# Patient Record
Sex: Female | Born: 1935 | Race: White | Hispanic: No | Smoking: Never smoker
Health system: Southern US, Community
[De-identification: ages and names within clinical notes are randomized; demographics above are authoritative.]

## PROBLEM LIST (undated history)

## (undated) DIAGNOSIS — E119 Type 2 diabetes mellitus without complications: Secondary | ICD-10-CM

## (undated) DIAGNOSIS — I10 Essential (primary) hypertension: Secondary | ICD-10-CM

## (undated) DIAGNOSIS — R7303 Prediabetes: Secondary | ICD-10-CM

## (undated) DIAGNOSIS — N189 Chronic kidney disease, unspecified: Secondary | ICD-10-CM

---

## 2000-01-14 ENCOUNTER — Ambulatory Visit (HOSPITAL_COMMUNITY): Admission: RE | Admit: 2000-01-14 | Discharge: 2000-01-14 | Payer: Self-pay | Admitting: Ophthalmology

## 2000-04-14 ENCOUNTER — Ambulatory Visit (HOSPITAL_COMMUNITY): Admission: RE | Admit: 2000-04-14 | Discharge: 2000-04-14 | Payer: Self-pay | Admitting: Ophthalmology

## 2002-02-14 ENCOUNTER — Other Ambulatory Visit: Admission: RE | Admit: 2002-02-14 | Discharge: 2002-02-14 | Payer: Self-pay | Admitting: Obstetrics and Gynecology

## 2011-05-08 DIAGNOSIS — H04209 Unspecified epiphora, unspecified lacrimal gland: Secondary | ICD-10-CM | POA: Diagnosis not present

## 2011-08-20 DIAGNOSIS — I129 Hypertensive chronic kidney disease with stage 1 through stage 4 chronic kidney disease, or unspecified chronic kidney disease: Secondary | ICD-10-CM | POA: Diagnosis not present

## 2011-08-20 DIAGNOSIS — E785 Hyperlipidemia, unspecified: Secondary | ICD-10-CM | POA: Diagnosis not present

## 2011-08-20 DIAGNOSIS — E119 Type 2 diabetes mellitus without complications: Secondary | ICD-10-CM | POA: Diagnosis not present

## 2011-08-20 DIAGNOSIS — I10 Essential (primary) hypertension: Secondary | ICD-10-CM | POA: Diagnosis not present

## 2011-11-24 DIAGNOSIS — Z23 Encounter for immunization: Secondary | ICD-10-CM | POA: Diagnosis not present

## 2011-11-24 DIAGNOSIS — I10 Essential (primary) hypertension: Secondary | ICD-10-CM | POA: Diagnosis not present

## 2011-11-24 DIAGNOSIS — E7800 Pure hypercholesterolemia, unspecified: Secondary | ICD-10-CM | POA: Diagnosis not present

## 2011-11-24 DIAGNOSIS — N189 Chronic kidney disease, unspecified: Secondary | ICD-10-CM | POA: Diagnosis not present

## 2011-11-24 DIAGNOSIS — E119 Type 2 diabetes mellitus without complications: Secondary | ICD-10-CM | POA: Diagnosis not present

## 2011-11-24 DIAGNOSIS — Z79890 Hormone replacement therapy (postmenopausal): Secondary | ICD-10-CM | POA: Diagnosis not present

## 2012-01-16 DIAGNOSIS — T783XXA Angioneurotic edema, initial encounter: Secondary | ICD-10-CM | POA: Diagnosis not present

## 2012-01-21 DIAGNOSIS — Z961 Presence of intraocular lens: Secondary | ICD-10-CM | POA: Diagnosis not present

## 2012-02-03 DIAGNOSIS — Z1231 Encounter for screening mammogram for malignant neoplasm of breast: Secondary | ICD-10-CM | POA: Diagnosis not present

## 2012-05-03 DIAGNOSIS — E119 Type 2 diabetes mellitus without complications: Secondary | ICD-10-CM | POA: Diagnosis not present

## 2012-05-03 DIAGNOSIS — E213 Hyperparathyroidism, unspecified: Secondary | ICD-10-CM | POA: Diagnosis not present

## 2012-05-03 DIAGNOSIS — I129 Hypertensive chronic kidney disease with stage 1 through stage 4 chronic kidney disease, or unspecified chronic kidney disease: Secondary | ICD-10-CM | POA: Diagnosis not present

## 2012-05-13 DIAGNOSIS — D235 Other benign neoplasm of skin of trunk: Secondary | ICD-10-CM | POA: Diagnosis not present

## 2012-05-13 DIAGNOSIS — L821 Other seborrheic keratosis: Secondary | ICD-10-CM | POA: Diagnosis not present

## 2012-05-24 DIAGNOSIS — Z79890 Hormone replacement therapy (postmenopausal): Secondary | ICD-10-CM | POA: Diagnosis not present

## 2012-05-24 DIAGNOSIS — E119 Type 2 diabetes mellitus without complications: Secondary | ICD-10-CM | POA: Diagnosis not present

## 2012-05-24 DIAGNOSIS — I10 Essential (primary) hypertension: Secondary | ICD-10-CM | POA: Diagnosis not present

## 2012-05-24 DIAGNOSIS — Z23 Encounter for immunization: Secondary | ICD-10-CM | POA: Diagnosis not present

## 2012-05-24 DIAGNOSIS — E7800 Pure hypercholesterolemia, unspecified: Secondary | ICD-10-CM | POA: Diagnosis not present

## 2012-05-24 DIAGNOSIS — M899 Disorder of bone, unspecified: Secondary | ICD-10-CM | POA: Diagnosis not present

## 2012-05-24 DIAGNOSIS — N189 Chronic kidney disease, unspecified: Secondary | ICD-10-CM | POA: Diagnosis not present

## 2012-05-24 DIAGNOSIS — M949 Disorder of cartilage, unspecified: Secondary | ICD-10-CM | POA: Diagnosis not present

## 2012-10-05 DIAGNOSIS — J209 Acute bronchitis, unspecified: Secondary | ICD-10-CM | POA: Diagnosis not present

## 2012-11-23 DIAGNOSIS — I10 Essential (primary) hypertension: Secondary | ICD-10-CM | POA: Diagnosis not present

## 2012-11-23 DIAGNOSIS — E119 Type 2 diabetes mellitus without complications: Secondary | ICD-10-CM | POA: Diagnosis not present

## 2012-11-23 DIAGNOSIS — I129 Hypertensive chronic kidney disease with stage 1 through stage 4 chronic kidney disease, or unspecified chronic kidney disease: Secondary | ICD-10-CM | POA: Diagnosis not present

## 2012-11-26 DIAGNOSIS — R0989 Other specified symptoms and signs involving the circulatory and respiratory systems: Secondary | ICD-10-CM | POA: Diagnosis not present

## 2012-11-26 DIAGNOSIS — I10 Essential (primary) hypertension: Secondary | ICD-10-CM | POA: Diagnosis not present

## 2012-11-26 DIAGNOSIS — E119 Type 2 diabetes mellitus without complications: Secondary | ICD-10-CM | POA: Diagnosis not present

## 2012-11-26 DIAGNOSIS — N189 Chronic kidney disease, unspecified: Secondary | ICD-10-CM | POA: Diagnosis not present

## 2012-11-26 DIAGNOSIS — E7800 Pure hypercholesterolemia, unspecified: Secondary | ICD-10-CM | POA: Diagnosis not present

## 2012-12-15 DIAGNOSIS — D649 Anemia, unspecified: Secondary | ICD-10-CM | POA: Diagnosis not present

## 2012-12-20 ENCOUNTER — Ambulatory Visit (HOSPITAL_COMMUNITY): Payer: Medicare Other | Attending: Cardiology

## 2012-12-20 DIAGNOSIS — E119 Type 2 diabetes mellitus without complications: Secondary | ICD-10-CM | POA: Insufficient documentation

## 2012-12-20 DIAGNOSIS — E1159 Type 2 diabetes mellitus with other circulatory complications: Secondary | ICD-10-CM | POA: Diagnosis not present

## 2012-12-20 DIAGNOSIS — I10 Essential (primary) hypertension: Secondary | ICD-10-CM | POA: Diagnosis not present

## 2012-12-20 DIAGNOSIS — R0989 Other specified symptoms and signs involving the circulatory and respiratory systems: Secondary | ICD-10-CM | POA: Insufficient documentation

## 2012-12-20 DIAGNOSIS — Z87891 Personal history of nicotine dependence: Secondary | ICD-10-CM | POA: Diagnosis not present

## 2012-12-22 DIAGNOSIS — Z23 Encounter for immunization: Secondary | ICD-10-CM | POA: Diagnosis not present

## 2013-01-03 DIAGNOSIS — H04129 Dry eye syndrome of unspecified lacrimal gland: Secondary | ICD-10-CM | POA: Diagnosis not present

## 2013-01-03 DIAGNOSIS — Z961 Presence of intraocular lens: Secondary | ICD-10-CM | POA: Diagnosis not present

## 2013-01-03 DIAGNOSIS — H524 Presbyopia: Secondary | ICD-10-CM | POA: Diagnosis not present

## 2013-02-28 DIAGNOSIS — Z1231 Encounter for screening mammogram for malignant neoplasm of breast: Secondary | ICD-10-CM | POA: Diagnosis not present

## 2013-05-27 DIAGNOSIS — Z79890 Hormone replacement therapy (postmenopausal): Secondary | ICD-10-CM | POA: Diagnosis not present

## 2013-05-27 DIAGNOSIS — N189 Chronic kidney disease, unspecified: Secondary | ICD-10-CM | POA: Diagnosis not present

## 2013-05-27 DIAGNOSIS — I10 Essential (primary) hypertension: Secondary | ICD-10-CM | POA: Diagnosis not present

## 2013-05-27 DIAGNOSIS — D649 Anemia, unspecified: Secondary | ICD-10-CM | POA: Diagnosis not present

## 2013-05-27 DIAGNOSIS — E7800 Pure hypercholesterolemia, unspecified: Secondary | ICD-10-CM | POA: Diagnosis not present

## 2013-05-27 DIAGNOSIS — E119 Type 2 diabetes mellitus without complications: Secondary | ICD-10-CM | POA: Diagnosis not present

## 2013-05-27 DIAGNOSIS — Z1331 Encounter for screening for depression: Secondary | ICD-10-CM | POA: Diagnosis not present

## 2013-05-27 DIAGNOSIS — M899 Disorder of bone, unspecified: Secondary | ICD-10-CM | POA: Diagnosis not present

## 2013-05-27 DIAGNOSIS — M949 Disorder of cartilage, unspecified: Secondary | ICD-10-CM | POA: Diagnosis not present

## 2013-07-05 DIAGNOSIS — N183 Chronic kidney disease, stage 3 unspecified: Secondary | ICD-10-CM | POA: Diagnosis not present

## 2013-07-05 DIAGNOSIS — I129 Hypertensive chronic kidney disease with stage 1 through stage 4 chronic kidney disease, or unspecified chronic kidney disease: Secondary | ICD-10-CM | POA: Diagnosis not present

## 2013-07-05 DIAGNOSIS — N2581 Secondary hyperparathyroidism of renal origin: Secondary | ICD-10-CM | POA: Diagnosis not present

## 2013-07-05 DIAGNOSIS — E785 Hyperlipidemia, unspecified: Secondary | ICD-10-CM | POA: Diagnosis not present

## 2013-07-05 DIAGNOSIS — E1129 Type 2 diabetes mellitus with other diabetic kidney complication: Secondary | ICD-10-CM | POA: Diagnosis not present

## 2013-08-31 DIAGNOSIS — Z111 Encounter for screening for respiratory tuberculosis: Secondary | ICD-10-CM | POA: Diagnosis not present

## 2013-08-31 DIAGNOSIS — Z0000 Encounter for general adult medical examination without abnormal findings: Secondary | ICD-10-CM | POA: Diagnosis not present

## 2013-08-31 DIAGNOSIS — Z1331 Encounter for screening for depression: Secondary | ICD-10-CM | POA: Diagnosis not present

## 2013-09-02 ENCOUNTER — Ambulatory Visit
Admission: RE | Admit: 2013-09-02 | Discharge: 2013-09-02 | Disposition: A | Discharge status: Home / Self Care | Payer: Medicare Other | Source: Ambulatory Visit | Attending: Family Medicine | Admitting: Family Medicine

## 2013-09-02 ENCOUNTER — Other Ambulatory Visit: Payer: Self-pay | Admitting: Family Medicine

## 2013-09-02 DIAGNOSIS — R9389 Abnormal findings on diagnostic imaging of other specified body structures: Secondary | ICD-10-CM

## 2013-09-02 DIAGNOSIS — J984 Other disorders of lung: Secondary | ICD-10-CM | POA: Diagnosis not present

## 2013-09-08 ENCOUNTER — Other Ambulatory Visit: Payer: Self-pay | Admitting: Family Medicine

## 2013-09-08 DIAGNOSIS — R9389 Abnormal findings on diagnostic imaging of other specified body structures: Secondary | ICD-10-CM

## 2013-09-09 ENCOUNTER — Ambulatory Visit
Admission: RE | Admit: 2013-09-09 | Discharge: 2013-09-09 | Disposition: A | Discharge status: Home / Self Care | Payer: Medicare Other | Source: Ambulatory Visit | Attending: Family Medicine | Admitting: Family Medicine

## 2013-09-09 DIAGNOSIS — E079 Disorder of thyroid, unspecified: Secondary | ICD-10-CM | POA: Diagnosis not present

## 2013-09-09 DIAGNOSIS — R9389 Abnormal findings on diagnostic imaging of other specified body structures: Secondary | ICD-10-CM

## 2013-11-28 DIAGNOSIS — Z23 Encounter for immunization: Secondary | ICD-10-CM | POA: Diagnosis not present

## 2013-11-28 DIAGNOSIS — M899 Disorder of bone, unspecified: Secondary | ICD-10-CM | POA: Diagnosis not present

## 2013-11-28 DIAGNOSIS — E119 Type 2 diabetes mellitus without complications: Secondary | ICD-10-CM | POA: Diagnosis not present

## 2013-11-28 DIAGNOSIS — E7800 Pure hypercholesterolemia, unspecified: Secondary | ICD-10-CM | POA: Diagnosis not present

## 2013-11-28 DIAGNOSIS — M949 Disorder of cartilage, unspecified: Secondary | ICD-10-CM | POA: Diagnosis not present

## 2013-11-28 DIAGNOSIS — I10 Essential (primary) hypertension: Secondary | ICD-10-CM | POA: Diagnosis not present

## 2013-11-28 DIAGNOSIS — N189 Chronic kidney disease, unspecified: Secondary | ICD-10-CM | POA: Diagnosis not present

## 2013-12-28 DIAGNOSIS — H5212 Myopia, left eye: Secondary | ICD-10-CM | POA: Diagnosis not present

## 2013-12-28 DIAGNOSIS — H52202 Unspecified astigmatism, left eye: Secondary | ICD-10-CM | POA: Diagnosis not present

## 2013-12-28 DIAGNOSIS — H52201 Unspecified astigmatism, right eye: Secondary | ICD-10-CM | POA: Diagnosis not present

## 2013-12-28 DIAGNOSIS — Z961 Presence of intraocular lens: Secondary | ICD-10-CM | POA: Diagnosis not present

## 2014-01-02 DIAGNOSIS — M899 Disorder of bone, unspecified: Secondary | ICD-10-CM | POA: Diagnosis not present

## 2014-03-28 DIAGNOSIS — E782 Mixed hyperlipidemia: Secondary | ICD-10-CM | POA: Diagnosis not present

## 2014-03-28 DIAGNOSIS — N2581 Secondary hyperparathyroidism of renal origin: Secondary | ICD-10-CM | POA: Diagnosis not present

## 2014-03-28 DIAGNOSIS — I129 Hypertensive chronic kidney disease with stage 1 through stage 4 chronic kidney disease, or unspecified chronic kidney disease: Secondary | ICD-10-CM | POA: Diagnosis not present

## 2014-03-28 DIAGNOSIS — E1122 Type 2 diabetes mellitus with diabetic chronic kidney disease: Secondary | ICD-10-CM | POA: Diagnosis not present

## 2014-03-28 DIAGNOSIS — N183 Chronic kidney disease, stage 3 (moderate): Secondary | ICD-10-CM | POA: Diagnosis not present

## 2014-04-12 DIAGNOSIS — Z1231 Encounter for screening mammogram for malignant neoplasm of breast: Secondary | ICD-10-CM | POA: Diagnosis not present

## 2014-05-29 DIAGNOSIS — E119 Type 2 diabetes mellitus without complications: Secondary | ICD-10-CM | POA: Diagnosis not present

## 2014-05-29 DIAGNOSIS — Z79890 Hormone replacement therapy (postmenopausal): Secondary | ICD-10-CM | POA: Diagnosis not present

## 2014-05-29 DIAGNOSIS — I10 Essential (primary) hypertension: Secondary | ICD-10-CM | POA: Diagnosis not present

## 2014-05-29 DIAGNOSIS — M8580 Other specified disorders of bone density and structure, unspecified site: Secondary | ICD-10-CM | POA: Diagnosis not present

## 2014-05-29 DIAGNOSIS — E780 Pure hypercholesterolemia: Secondary | ICD-10-CM | POA: Diagnosis not present

## 2014-05-29 DIAGNOSIS — N189 Chronic kidney disease, unspecified: Secondary | ICD-10-CM | POA: Diagnosis not present

## 2014-11-07 DIAGNOSIS — N183 Chronic kidney disease, stage 3 (moderate): Secondary | ICD-10-CM | POA: Diagnosis not present

## 2014-11-07 DIAGNOSIS — D631 Anemia in chronic kidney disease: Secondary | ICD-10-CM | POA: Diagnosis not present

## 2014-11-07 DIAGNOSIS — E1122 Type 2 diabetes mellitus with diabetic chronic kidney disease: Secondary | ICD-10-CM | POA: Diagnosis not present

## 2014-11-07 DIAGNOSIS — I129 Hypertensive chronic kidney disease with stage 1 through stage 4 chronic kidney disease, or unspecified chronic kidney disease: Secondary | ICD-10-CM | POA: Diagnosis not present

## 2014-11-07 DIAGNOSIS — N2581 Secondary hyperparathyroidism of renal origin: Secondary | ICD-10-CM | POA: Diagnosis not present

## 2014-11-29 DIAGNOSIS — Z1389 Encounter for screening for other disorder: Secondary | ICD-10-CM | POA: Diagnosis not present

## 2014-11-29 DIAGNOSIS — Z79890 Hormone replacement therapy (postmenopausal): Secondary | ICD-10-CM | POA: Diagnosis not present

## 2014-11-29 DIAGNOSIS — E119 Type 2 diabetes mellitus without complications: Secondary | ICD-10-CM | POA: Diagnosis not present

## 2014-11-29 DIAGNOSIS — N189 Chronic kidney disease, unspecified: Secondary | ICD-10-CM | POA: Diagnosis not present

## 2014-11-29 DIAGNOSIS — E780 Pure hypercholesterolemia: Secondary | ICD-10-CM | POA: Diagnosis not present

## 2014-11-29 DIAGNOSIS — I10 Essential (primary) hypertension: Secondary | ICD-10-CM | POA: Diagnosis not present

## 2014-11-29 DIAGNOSIS — Z23 Encounter for immunization: Secondary | ICD-10-CM | POA: Diagnosis not present

## 2015-03-22 DIAGNOSIS — M79601 Pain in right arm: Secondary | ICD-10-CM | POA: Diagnosis not present

## 2015-03-29 DIAGNOSIS — S4991XA Unspecified injury of right shoulder and upper arm, initial encounter: Secondary | ICD-10-CM | POA: Diagnosis not present

## 2015-03-29 DIAGNOSIS — M25511 Pain in right shoulder: Secondary | ICD-10-CM | POA: Diagnosis not present

## 2015-04-06 DIAGNOSIS — M25511 Pain in right shoulder: Secondary | ICD-10-CM | POA: Diagnosis not present

## 2015-04-06 DIAGNOSIS — G8929 Other chronic pain: Secondary | ICD-10-CM | POA: Diagnosis not present

## 2015-04-11 DIAGNOSIS — H40003 Preglaucoma, unspecified, bilateral: Secondary | ICD-10-CM | POA: Diagnosis not present

## 2015-04-11 DIAGNOSIS — Z961 Presence of intraocular lens: Secondary | ICD-10-CM | POA: Diagnosis not present

## 2015-04-11 DIAGNOSIS — H35371 Puckering of macula, right eye: Secondary | ICD-10-CM | POA: Diagnosis not present

## 2015-04-18 DIAGNOSIS — Z803 Family history of malignant neoplasm of breast: Secondary | ICD-10-CM | POA: Diagnosis not present

## 2015-04-18 DIAGNOSIS — Z1231 Encounter for screening mammogram for malignant neoplasm of breast: Secondary | ICD-10-CM | POA: Diagnosis not present

## 2015-05-10 DIAGNOSIS — S4991XD Unspecified injury of right shoulder and upper arm, subsequent encounter: Secondary | ICD-10-CM | POA: Diagnosis not present

## 2015-05-10 DIAGNOSIS — M25511 Pain in right shoulder: Secondary | ICD-10-CM | POA: Diagnosis not present

## 2015-06-15 DIAGNOSIS — R05 Cough: Secondary | ICD-10-CM | POA: Diagnosis not present

## 2015-08-09 DIAGNOSIS — I129 Hypertensive chronic kidney disease with stage 1 through stage 4 chronic kidney disease, or unspecified chronic kidney disease: Secondary | ICD-10-CM | POA: Diagnosis not present

## 2015-08-09 DIAGNOSIS — E782 Mixed hyperlipidemia: Secondary | ICD-10-CM | POA: Diagnosis not present

## 2015-08-09 DIAGNOSIS — D631 Anemia in chronic kidney disease: Secondary | ICD-10-CM | POA: Diagnosis not present

## 2015-08-09 DIAGNOSIS — N183 Chronic kidney disease, stage 3 (moderate): Secondary | ICD-10-CM | POA: Diagnosis not present

## 2015-08-09 DIAGNOSIS — N2581 Secondary hyperparathyroidism of renal origin: Secondary | ICD-10-CM | POA: Diagnosis not present

## 2015-08-09 DIAGNOSIS — Z0000 Encounter for general adult medical examination without abnormal findings: Secondary | ICD-10-CM | POA: Diagnosis not present

## 2015-08-09 DIAGNOSIS — E1122 Type 2 diabetes mellitus with diabetic chronic kidney disease: Secondary | ICD-10-CM | POA: Diagnosis not present

## 2015-11-20 DIAGNOSIS — N189 Chronic kidney disease, unspecified: Secondary | ICD-10-CM | POA: Diagnosis not present

## 2015-11-20 DIAGNOSIS — I10 Essential (primary) hypertension: Secondary | ICD-10-CM | POA: Diagnosis not present

## 2015-11-20 DIAGNOSIS — B373 Candidiasis of vulva and vagina: Secondary | ICD-10-CM | POA: Diagnosis not present

## 2015-11-20 DIAGNOSIS — E119 Type 2 diabetes mellitus without complications: Secondary | ICD-10-CM | POA: Diagnosis not present

## 2015-11-20 DIAGNOSIS — E7800 Pure hypercholesterolemia, unspecified: Secondary | ICD-10-CM | POA: Diagnosis not present

## 2015-11-22 DIAGNOSIS — I10 Essential (primary) hypertension: Secondary | ICD-10-CM | POA: Diagnosis not present

## 2015-11-22 DIAGNOSIS — N189 Chronic kidney disease, unspecified: Secondary | ICD-10-CM | POA: Diagnosis not present

## 2015-11-22 DIAGNOSIS — E7800 Pure hypercholesterolemia, unspecified: Secondary | ICD-10-CM | POA: Diagnosis not present

## 2015-11-22 DIAGNOSIS — E119 Type 2 diabetes mellitus without complications: Secondary | ICD-10-CM | POA: Diagnosis not present

## 2015-11-22 DIAGNOSIS — B373 Candidiasis of vulva and vagina: Secondary | ICD-10-CM | POA: Diagnosis not present

## 2015-12-18 DIAGNOSIS — Z23 Encounter for immunization: Secondary | ICD-10-CM | POA: Diagnosis not present

## 2016-02-25 DIAGNOSIS — I129 Hypertensive chronic kidney disease with stage 1 through stage 4 chronic kidney disease, or unspecified chronic kidney disease: Secondary | ICD-10-CM | POA: Diagnosis not present

## 2016-02-25 DIAGNOSIS — E1122 Type 2 diabetes mellitus with diabetic chronic kidney disease: Secondary | ICD-10-CM | POA: Diagnosis not present

## 2016-02-25 DIAGNOSIS — N183 Chronic kidney disease, stage 3 (moderate): Secondary | ICD-10-CM | POA: Diagnosis not present

## 2016-02-25 DIAGNOSIS — D631 Anemia in chronic kidney disease: Secondary | ICD-10-CM | POA: Diagnosis not present

## 2016-02-25 DIAGNOSIS — N2581 Secondary hyperparathyroidism of renal origin: Secondary | ICD-10-CM | POA: Diagnosis not present

## 2016-02-25 DIAGNOSIS — E782 Mixed hyperlipidemia: Secondary | ICD-10-CM | POA: Diagnosis not present

## 2016-04-24 DIAGNOSIS — Z1231 Encounter for screening mammogram for malignant neoplasm of breast: Secondary | ICD-10-CM | POA: Diagnosis not present

## 2016-04-24 DIAGNOSIS — M85851 Other specified disorders of bone density and structure, right thigh: Secondary | ICD-10-CM | POA: Diagnosis not present

## 2016-04-28 DIAGNOSIS — H40013 Open angle with borderline findings, low risk, bilateral: Secondary | ICD-10-CM | POA: Diagnosis not present

## 2016-04-28 DIAGNOSIS — H35371 Puckering of macula, right eye: Secondary | ICD-10-CM | POA: Diagnosis not present

## 2016-04-28 DIAGNOSIS — Z961 Presence of intraocular lens: Secondary | ICD-10-CM | POA: Diagnosis not present

## 2016-09-01 DIAGNOSIS — E7800 Pure hypercholesterolemia, unspecified: Secondary | ICD-10-CM | POA: Diagnosis not present

## 2016-09-01 DIAGNOSIS — N189 Chronic kidney disease, unspecified: Secondary | ICD-10-CM | POA: Diagnosis not present

## 2016-09-01 DIAGNOSIS — E119 Type 2 diabetes mellitus without complications: Secondary | ICD-10-CM | POA: Diagnosis not present

## 2016-09-01 DIAGNOSIS — M25561 Pain in right knee: Secondary | ICD-10-CM | POA: Diagnosis not present

## 2016-09-01 DIAGNOSIS — I10 Essential (primary) hypertension: Secondary | ICD-10-CM | POA: Diagnosis not present

## 2016-09-08 DIAGNOSIS — D631 Anemia in chronic kidney disease: Secondary | ICD-10-CM | POA: Diagnosis not present

## 2016-09-08 DIAGNOSIS — E782 Mixed hyperlipidemia: Secondary | ICD-10-CM | POA: Diagnosis not present

## 2016-09-08 DIAGNOSIS — I129 Hypertensive chronic kidney disease with stage 1 through stage 4 chronic kidney disease, or unspecified chronic kidney disease: Secondary | ICD-10-CM | POA: Diagnosis not present

## 2016-09-08 DIAGNOSIS — N2581 Secondary hyperparathyroidism of renal origin: Secondary | ICD-10-CM | POA: Diagnosis not present

## 2016-09-08 DIAGNOSIS — N183 Chronic kidney disease, stage 3 (moderate): Secondary | ICD-10-CM | POA: Diagnosis not present

## 2016-09-08 DIAGNOSIS — E1122 Type 2 diabetes mellitus with diabetic chronic kidney disease: Secondary | ICD-10-CM | POA: Diagnosis not present

## 2016-09-12 DIAGNOSIS — G8929 Other chronic pain: Secondary | ICD-10-CM | POA: Diagnosis not present

## 2016-09-12 DIAGNOSIS — M1711 Unilateral primary osteoarthritis, right knee: Secondary | ICD-10-CM | POA: Diagnosis not present

## 2016-09-12 DIAGNOSIS — M25561 Pain in right knee: Secondary | ICD-10-CM | POA: Diagnosis not present

## 2016-09-23 DIAGNOSIS — M1711 Unilateral primary osteoarthritis, right knee: Secondary | ICD-10-CM | POA: Diagnosis not present

## 2016-11-16 DIAGNOSIS — Z23 Encounter for immunization: Secondary | ICD-10-CM | POA: Diagnosis not present

## 2016-11-21 DIAGNOSIS — K6289 Other specified diseases of anus and rectum: Secondary | ICD-10-CM | POA: Diagnosis not present

## 2017-03-24 DIAGNOSIS — N189 Chronic kidney disease, unspecified: Secondary | ICD-10-CM | POA: Diagnosis not present

## 2017-03-24 DIAGNOSIS — E7800 Pure hypercholesterolemia, unspecified: Secondary | ICD-10-CM | POA: Diagnosis not present

## 2017-03-24 DIAGNOSIS — M8580 Other specified disorders of bone density and structure, unspecified site: Secondary | ICD-10-CM | POA: Diagnosis not present

## 2017-03-24 DIAGNOSIS — E119 Type 2 diabetes mellitus without complications: Secondary | ICD-10-CM | POA: Diagnosis not present

## 2017-03-24 DIAGNOSIS — I10 Essential (primary) hypertension: Secondary | ICD-10-CM | POA: Diagnosis not present

## 2017-04-27 DIAGNOSIS — D631 Anemia in chronic kidney disease: Secondary | ICD-10-CM | POA: Diagnosis not present

## 2017-04-27 DIAGNOSIS — I129 Hypertensive chronic kidney disease with stage 1 through stage 4 chronic kidney disease, or unspecified chronic kidney disease: Secondary | ICD-10-CM | POA: Diagnosis not present

## 2017-04-27 DIAGNOSIS — E1122 Type 2 diabetes mellitus with diabetic chronic kidney disease: Secondary | ICD-10-CM | POA: Diagnosis not present

## 2017-04-27 DIAGNOSIS — N189 Chronic kidney disease, unspecified: Secondary | ICD-10-CM | POA: Diagnosis not present

## 2017-04-27 DIAGNOSIS — N183 Chronic kidney disease, stage 3 (moderate): Secondary | ICD-10-CM | POA: Diagnosis not present

## 2017-04-27 DIAGNOSIS — N2581 Secondary hyperparathyroidism of renal origin: Secondary | ICD-10-CM | POA: Diagnosis not present

## 2017-04-27 DIAGNOSIS — E782 Mixed hyperlipidemia: Secondary | ICD-10-CM | POA: Diagnosis not present

## 2017-04-29 DIAGNOSIS — H35371 Puckering of macula, right eye: Secondary | ICD-10-CM | POA: Diagnosis not present

## 2017-04-29 DIAGNOSIS — H40013 Open angle with borderline findings, low risk, bilateral: Secondary | ICD-10-CM | POA: Diagnosis not present

## 2017-04-29 DIAGNOSIS — Z961 Presence of intraocular lens: Secondary | ICD-10-CM | POA: Diagnosis not present

## 2017-04-29 DIAGNOSIS — E119 Type 2 diabetes mellitus without complications: Secondary | ICD-10-CM | POA: Diagnosis not present

## 2017-04-30 DIAGNOSIS — Z1231 Encounter for screening mammogram for malignant neoplasm of breast: Secondary | ICD-10-CM | POA: Diagnosis not present

## 2017-06-03 DIAGNOSIS — R000 Tachycardia, unspecified: Secondary | ICD-10-CM | POA: Diagnosis not present

## 2017-06-08 ENCOUNTER — Encounter (HOSPITAL_COMMUNITY): Payer: Self-pay | Admitting: Emergency Medicine

## 2017-06-08 ENCOUNTER — Emergency Department (HOSPITAL_COMMUNITY): Payer: Medicare Other

## 2017-06-08 ENCOUNTER — Inpatient Hospital Stay (HOSPITAL_COMMUNITY)
Admission: EM | Admit: 2017-06-08 | Discharge: 2017-06-10 | DRG: 308 | Disposition: A | Discharge status: Home / Self Care | Payer: Medicare Other | Attending: Cardiovascular Disease | Admitting: Cardiovascular Disease

## 2017-06-08 ENCOUNTER — Other Ambulatory Visit: Payer: Self-pay

## 2017-06-08 DIAGNOSIS — J180 Bronchopneumonia, unspecified organism: Secondary | ICD-10-CM | POA: Diagnosis present

## 2017-06-08 DIAGNOSIS — R000 Tachycardia, unspecified: Secondary | ICD-10-CM | POA: Diagnosis not present

## 2017-06-08 DIAGNOSIS — Z9889 Other specified postprocedural states: Secondary | ICD-10-CM

## 2017-06-08 DIAGNOSIS — I484 Atypical atrial flutter: Principal | ICD-10-CM | POA: Diagnosis present

## 2017-06-08 DIAGNOSIS — R9431 Abnormal electrocardiogram [ECG] [EKG]: Secondary | ICD-10-CM | POA: Diagnosis not present

## 2017-06-08 DIAGNOSIS — I471 Supraventricular tachycardia: Secondary | ICD-10-CM | POA: Diagnosis not present

## 2017-06-08 DIAGNOSIS — N184 Chronic kidney disease, stage 4 (severe): Secondary | ICD-10-CM

## 2017-06-08 DIAGNOSIS — I10 Essential (primary) hypertension: Secondary | ICD-10-CM

## 2017-06-08 DIAGNOSIS — E1122 Type 2 diabetes mellitus with diabetic chronic kidney disease: Secondary | ICD-10-CM | POA: Diagnosis not present

## 2017-06-08 DIAGNOSIS — Z880 Allergy status to penicillin: Secondary | ICD-10-CM

## 2017-06-08 DIAGNOSIS — I129 Hypertensive chronic kidney disease with stage 1 through stage 4 chronic kidney disease, or unspecified chronic kidney disease: Secondary | ICD-10-CM | POA: Diagnosis present

## 2017-06-08 DIAGNOSIS — I4892 Unspecified atrial flutter: Secondary | ICD-10-CM | POA: Diagnosis not present

## 2017-06-08 DIAGNOSIS — Z887 Allergy status to serum and vaccine status: Secondary | ICD-10-CM

## 2017-06-08 DIAGNOSIS — R911 Solitary pulmonary nodule: Secondary | ICD-10-CM | POA: Diagnosis present

## 2017-06-08 DIAGNOSIS — R7303 Prediabetes: Secondary | ICD-10-CM

## 2017-06-08 DIAGNOSIS — Z833 Family history of diabetes mellitus: Secondary | ICD-10-CM

## 2017-06-08 DIAGNOSIS — Z7982 Long term (current) use of aspirin: Secondary | ICD-10-CM

## 2017-06-08 DIAGNOSIS — Z79899 Other long term (current) drug therapy: Secondary | ICD-10-CM

## 2017-06-08 DIAGNOSIS — E785 Hyperlipidemia, unspecified: Secondary | ICD-10-CM | POA: Diagnosis present

## 2017-06-08 HISTORY — DX: Essential (primary) hypertension: I10

## 2017-06-08 HISTORY — DX: Chronic kidney disease, unspecified: N18.9

## 2017-06-08 HISTORY — DX: Type 2 diabetes mellitus without complications: E11.9

## 2017-06-08 HISTORY — DX: Prediabetes: R73.03

## 2017-06-08 LAB — BASIC METABOLIC PANEL
ANION GAP: 12 (ref 5–15)
BUN: 34 mg/dL — AB (ref 6–20)
CALCIUM: 9.8 mg/dL (ref 8.9–10.3)
CO2: 19 mmol/L — AB (ref 22–32)
CREATININE: 1.49 mg/dL — AB (ref 0.44–1.00)
Chloride: 106 mmol/L (ref 101–111)
GFR calc Af Amer: 37 mL/min — ABNORMAL LOW (ref >60)
GFR, EST NON AFRICAN AMERICAN: 32 mL/min — AB (ref >60)
GLUCOSE: 100 mg/dL — AB (ref 65–99)
Potassium: 5.3 mmol/L — ABNORMAL HIGH (ref 3.5–5.1)
Sodium: 137 mmol/L (ref 135–145)

## 2017-06-08 LAB — HEPATIC FUNCTION PANEL
ALBUMIN: 3.4 g/dL — AB (ref 3.5–5.0)
ALT: 16 U/L (ref 14–54)
AST: 28 U/L (ref 15–41)
Alkaline Phosphatase: 67 U/L (ref 38–126)
Total Bilirubin: 0.4 mg/dL (ref 0.3–1.2)
Total Protein: 6.8 g/dL (ref 6.5–8.1)

## 2017-06-08 LAB — HEMOGLOBIN A1C
HEMOGLOBIN A1C: 5.7 % — AB (ref 4.8–5.6)
Mean Plasma Glucose: 116.89 mg/dL

## 2017-06-08 LAB — CBC
HCT: 39.6 % (ref 36.0–46.0)
HEMOGLOBIN: 12.8 g/dL (ref 12.0–15.0)
MCH: 30.3 pg (ref 26.0–34.0)
MCHC: 32.3 g/dL (ref 30.0–36.0)
MCV: 93.6 fL (ref 78.0–100.0)
PLATELETS: 219 10*3/uL (ref 150–400)
RBC: 4.23 MIL/uL (ref 3.87–5.11)
RDW: 13.5 % (ref 11.5–15.5)
WBC: 6.6 10*3/uL (ref 4.0–10.5)

## 2017-06-08 LAB — I-STAT TROPONIN, ED: TROPONIN I, POC: 0 ng/mL (ref 0.00–0.08)

## 2017-06-08 LAB — PROTIME-INR
INR: 1.01
PROTHROMBIN TIME: 13.2 s (ref 11.4–15.2)

## 2017-06-08 LAB — TSH: TSH: 3.144 u[IU]/mL (ref 0.350–4.500)

## 2017-06-08 LAB — TROPONIN I

## 2017-06-08 LAB — MAGNESIUM: MAGNESIUM: 2 mg/dL (ref 1.7–2.4)

## 2017-06-08 MED ORDER — SODIUM CHLORIDE 0.9 % IV BOLUS
500.0000 mL | Freq: Once | INTRAVENOUS | Status: AC
  Administered 2017-06-08: 500 mL via INTRAVENOUS

## 2017-06-08 MED ORDER — ONDANSETRON HCL 4 MG/2ML IJ SOLN: 4.0000 mg | Freq: Four times a day (QID) | INTRAMUSCULAR | Status: DC | PRN

## 2017-06-08 MED ORDER — ADULT MULTIVITAMIN W/MINERALS CH
1.0000 | ORAL_TABLET | Freq: Every day | ORAL | Status: DC
  Administered 2017-06-09 – 2017-06-10 (×2): 1 via ORAL
  Filled 2017-06-08 (×2): qty 1

## 2017-06-08 MED ORDER — CALCIUM CARBONATE-VITAMIN D 500-200 MG-UNIT PO TABS
1.0000 | ORAL_TABLET | Freq: Every day | ORAL | Status: DC
  Administered 2017-06-09 – 2017-06-10 (×2): 1 via ORAL
  Filled 2017-06-08 (×2): qty 1

## 2017-06-08 MED ORDER — ADENOSINE 6 MG/2ML IV SOLN
INTRAVENOUS | Status: AC
  Administered 2017-06-08: 6 mg via INTRAVENOUS
  Filled 2017-06-08: qty 4

## 2017-06-08 MED ORDER — ESTROGENS CONJUGATED 0.45 MG PO TABS
0.4500 mg | ORAL_TABLET | Freq: Every day | ORAL | Status: DC
  Administered 2017-06-08 – 2017-06-09 (×2): 0.45 mg via ORAL
  Filled 2017-06-08 (×3): qty 1

## 2017-06-08 MED ORDER — METOPROLOL TARTRATE 12.5 MG HALF TABLET
12.5000 mg | ORAL_TABLET | Freq: Two times a day (BID) | ORAL | Status: DC
  Administered 2017-06-08: 12.5 mg via ORAL
  Filled 2017-06-08: qty 1

## 2017-06-08 MED ORDER — LISINOPRIL 5 MG PO TABS: 5.0000 mg | ORAL_TABLET | Freq: Every day | ORAL | Status: DC

## 2017-06-08 MED ORDER — ADENOSINE 6 MG/2ML IV SOLN
6.0000 mg | Freq: Once | INTRAVENOUS | Status: AC
  Administered 2017-06-08: 6 mg via INTRAVENOUS

## 2017-06-08 MED ORDER — ATORVASTATIN CALCIUM 40 MG PO TABS
40.0000 mg | ORAL_TABLET | Freq: Every day | ORAL | Status: DC
  Administered 2017-06-08 – 2017-06-09 (×2): 40 mg via ORAL
  Filled 2017-06-08 (×2): qty 1

## 2017-06-08 MED ORDER — VITAMIN D 1000 UNITS PO TABS
1000.0000 [IU] | ORAL_TABLET | Freq: Every day | ORAL | Status: DC
  Administered 2017-06-09 – 2017-06-10 (×2): 1000 [IU] via ORAL
  Filled 2017-06-08 (×2): qty 1

## 2017-06-08 MED ORDER — ADENOSINE 6 MG/2ML IV SOLN: 12.0000 mg | Freq: Once | INTRAVENOUS | Status: DC

## 2017-06-08 MED ORDER — APIXABAN 2.5 MG PO TABS
2.5000 mg | ORAL_TABLET | Freq: Two times a day (BID) | ORAL | Status: DC
  Administered 2017-06-08 – 2017-06-10 (×4): 2.5 mg via ORAL
  Filled 2017-06-08 (×4): qty 1

## 2017-06-08 MED ORDER — ACETAMINOPHEN 325 MG PO TABS: 650.0000 mg | ORAL_TABLET | ORAL | Status: DC | PRN

## 2017-06-08 NOTE — ED Notes (Signed)
Admitting paged per RN

## 2017-06-08 NOTE — ED Triage Notes (Signed)
Pt arrives to ED from PCP after being told to come here due to tachycardia. Pt state she feel normal, no pain, no sob. Pt states she has been taking her heart rate at home and noted it to be in 120's for the last 1 week.

## 2017-06-08 NOTE — Progress Notes (Signed)
ANTICOAGULATION CONSULT NOTE - Initial Consult  Pharmacy Consult:  Eliquis Indication: atrial fibrillation  Allergies  Allergen Reactions  . Amoxicillin Swelling  . Other   . Tuberculin Ppd     Patient Measurements: Height: 5\' 8"  (172.7 cm) Weight: 149 lb (67.6 kg) IBW/kg (Calculated) : 63.9  Vital Signs: Temp: 98 F (36.7 C) (03/25 1134) Temp Source: Oral (03/25 1134) BP: 112/79 (03/25 1730) Pulse Rate: 124 (03/25 1730)  Labs: Recent Labs    06/08/17 1148  HGB 12.8  HCT 39.6  PLT 219  CREATININE 1.49*    Estimated Creatinine Clearance: 30.4 mL/min (A) (by C-G formula based on SCr of 1.49 mg/dL (H)).   Medical History: Past Medical History:  Diagnosis Date  . CKD (chronic kidney disease)    stage 3   . Diabetes mellitus without complication (HCC)    pre-diabetes   . Hypertension       Assessment: 62 YOF presented with fast heart rate x 1 week, and was given adenosine in the ED.  Pharmacy consulted to dose Eliquis for Afib if okay with EP.  Spoke to Dr. Lovena Le, okay to start Eliquis.  Patient is exactly 83 year old and SCr is at 1.5 (baseline unknown).  Will proceed with reduced Eliquis dose and monitor.   Goal of Therapy:  Appropriate anticoagulation Monitor platelets by anticoagulation protocol: Yes    Plan:  Eliquis 2.5mg  PO BID Monitor for s/sx of bleeding, SCr and possibility of increasing Eliquis dose   Lylianna Fraiser D. Mina Marble, PharmD, BCPS Pager:  [phone] 06/08/2017, 6:10 PM

## 2017-06-08 NOTE — ED Provider Notes (Signed)
[facility] EMERGENCY DEPARTMENT Provider Note   CSN: [id number] Arrival date & time: 06/08/17  1127     History   Chief Complaint Chief Complaint  Patient presents with  . Tachycardia  . Abnormal ECG    HPI Darlene Wang is a 83 y.o. female.  The history is provided by the patient. No language interpreter was used.   Darlene Wang is a 83 y.o. female who presents to the Emergency Department complaining of tachycardia. She presents to the emergency department upon referral by her PCP for evaluation of tachycardia. She is noticed for the last several weeks when she checks her blood pressure that her heart rate is elevated in the one teens 120s. She is completely asymptomatic. She denies any fevers, chest pain, shortness of breath, diaphoresis, Donna pain, nausea, vomiting, weight loss, leg swelling or pain. She has a history of CKD and hypertension. She had her thyroid checked not long ago and the labs were within normal limits. She was noted to be tachycardic at PCP's office and referred to the emergency department for further evaluation. Past Medical History:  Diagnosis Date  . CKD (chronic kidney disease)    stage 3   . Diabetes mellitus without complication (HCC)    pre-diabetes   . Hypertension     There are no active problems to display for this patient.   History reviewed. No pertinent surgical history.   OB History   None      Home Medications    Prior to Admission medications   Medication Sig Start Date End Date Taking? Authorizing Provider  aspirin EC 81 MG tablet Take 81 mg by mouth.   Yes [provider]  atorvastatin (LIPITOR) 40 MG tablet Take 40 mg by mouth at bedtime.   Yes [provider]  Calcium Carbonate-Vitamin D (CALCIUM 600+D) 600-200 MG-UNIT TABS Take 600 mg by mouth daily.   Yes [provider]  cholecalciferol (VITAMIN D) 1000 units tablet Take 1,000 Units by mouth daily.   Yes [provider]  estrogens, conjugated, (PREMARIN) 0.45 MG tablet Take 0.45 mg by mouth at bedtime.   Yes [provider]  lisinopril (PRINIVIL,ZESTRIL) 20 MG tablet Take 20 mg by mouth 2 (two) times daily.   Yes [provider]  Multiple Vitamin (MULTIVITAMIN) capsule Take 1 capsule by mouth daily.   Yes [provider]    Family History Family History  Problem Relation Age of Onset  . Heart failure Mother   . Stroke Father   . Diabetes Father   . Stroke Sister     Social History Social History   Tobacco Use  . Smoking status: Never Smoker  . Smokeless tobacco: Never Used  Substance Use Topics  . Alcohol use: Not on file  . Drug use: Never     Allergies   Amoxicillin; Other; and Tuberculin ppd   Review of Systems Review of Systems  All other systems reviewed and are negative.    Physical Exam Updated Vital Signs BP 129/82   Pulse (!) 117   Temp 98 F (36.7 C) (Oral)   Resp 18   Ht 5\' 8"  (1.727 m)   Wt 67.6 kg (149 lb)   SpO2 98%   BMI 22.66 kg/m   Physical Exam  Constitutional: She is oriented to person, place, and time. She appears well-developed and well-nourished.  HENT:  Head: Normocephalic and atraumatic.  Cardiovascular: Regular rhythm.  No murmur heard. tachycardic  Pulmonary/Chest:  Effort normal and breath sounds normal. No respiratory distress.  Abdominal: Soft. There is no tenderness. There is no rebound and no guarding.  Musculoskeletal: She exhibits no edema or tenderness.  Neurological: She is alert and oriented to person, place, and time.  Skin: Skin is warm and dry.  Psychiatric: She has a normal mood and affect. Her behavior is normal.  Nursing note and vitals reviewed.    ED Treatments / Results  Labs (all labs ordered are listed, but only abnormal results are displayed) Labs Reviewed  BASIC METABOLIC PANEL - Abnormal; Notable for the following components:      Result Value   Potassium 5.3 (*)    CO2  19 (*)    Glucose, Bld 100 (*)    BUN 34 (*)    Creatinine, Ser 1.49 (*)    GFR calc non Af Amer 32 (*)    GFR calc Af Amer 37 (*)    All other components within normal limits  CBC  I-STAT TROPONIN, ED    EKG EKG Interpretation  Date/Time:  Monday June 08 2017 11:34:49 EDT Ventricular Rate:  119 PR Interval:  154 QRS Duration: 68 QT Interval:  316 QTC Calculation: 444 R Axis:   66 Text Interpretation:  Sinus tachycardia Possible Anterior infarct , age undetermined Abnormal ECG Confirmed by Quintella Reichert [phone]) on 06/08/2017 2:10:56 PM   Radiology Dg Chest 2 View  Result Date: 06/08/2017 CLINICAL DATA:  Tachycardia EXAM: CHEST - 2 VIEW COMPARISON:  06/15/2015 FINDINGS: There is patchy density at the left base and right perihilar lung. Chronic patchy opacity at the apices, greater on the right. This apical opacities attributed to scarring based on 2015 chest CT. No Kerley lines, effusion, or pneumothorax. Normal heart size. Mild aortic tortuosity. IMPRESSION: 1. Patchy right perihilar and left base opacity compatible with infection in the appropriate clinical setting. If infectious symptoms, followup PA and lateral chest X-ray is recommended in 3-4 weeks following trial of antibiotic therapy to ensure resolution. If no infectious symptoms, a CT could further evaluate. 2. Biapical pleural scarring. Electronically Signed   By: Monte Fantasia M.D.   On: 06/08/2017 13:34    Procedures Procedures (including critical care time)  Medications Ordered in ED Medications  adenosine (ADENOCARD) 6 MG/2ML injection 12 mg ( Intravenous Canceled Entry 06/08/17 1645)  metoprolol tartrate (LOPRESSOR) tablet 12.5 mg (has no administration in time range)  lisinopril (PRINIVIL,ZESTRIL) tablet 5 mg (has no administration in time range)  sodium chloride 0.9 % bolus 500 mL (500 mLs Intravenous Given 06/08/17 1502)  adenosine (ADENOCARD) 6 MG/2ML injection 6 mg (6 mg Intravenous Given 06/08/17 1638)      Initial Impression / Assessment and Plan / ED Course  I have reviewed the triage vital signs and the nursing notes.  Pertinent labs & imaging results that were available during my care of the patient were reviewed by me and considered in my medical decision making (see chart for details).     Patient referred to the emergency department for asymptomatic tachycardia. She is tachycardic but well appearing on examination. She does have chronic kidney disease. Outpatient labs reviewed and at the bedside. Her renal disease is stable with stable hyperkalemia and creatinine. Bicarb is slightly decreased compared to the prior. Providing gentle IV fluid hydration. EKG with sinus tachy versus atrial tachycardia. Presentation is not consistent with PE, CHF, ACS. Cardiology consulted for recommendations.  Chest x-ray doesn't demonstrate patchy infiltrate, current clinical picture is not consistent with pneumonia. Discussed  this finding with patient and recommend CT chest to further evaluate and she declines a CT at this time.  Final Clinical Impressions(s) / ED Diagnoses   Final diagnoses:  None    ED Discharge Orders    None       Quintella Reichert, MD 06/08/17 1737

## 2017-06-08 NOTE — ED Notes (Signed)
Pt food at bedside. 

## 2017-06-08 NOTE — ED Notes (Signed)
Admitting repaged to RN per her request  

## 2017-06-08 NOTE — Consult Note (Addendum)
Cardiology Consultation:   Patient ID: Darlene Wang; [id number]; 1934-09-22   Admit date: 06/08/2017 Date of Consult: 06/08/2017  Primary Care Provider: [name], MD Primary Cardiologist: New to [facility], Dr. Johnsie Cancel   Patient Profile:   Darlene Wang is a 83 y.o. female with a hx of DM, CKD(III), HTN who is being seen today for the evaluation of AT/AFlutter at the request of Dr. Johnsie Cancel.  History of Present Illness:   Ms. Hottel was referred to the ER by her PMD for tachycardia. 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  She had no symptoms of CP, did notice of late in the last few days or week feeling her heart beat strong/fast only at night.  She is very active, exercises routinely with water aerobics, denies any kind of exertional intolerances at baseline or of late.  Outside of her BP machine noting fast rates and nighttime palpitations, she feels very well and considers herself pretty healthy.   She sought advice from fellow nrses who suggested she get checked, she went to a walk-in clinic was told her heart rhythm was abnormal and needed to see her PMD, she did and was referred to the ER.  She has not felt ill at all, no new medicines or recent illness.  No CP, no SOB or cough  She was given adenosine with slowing of V response, remained in Aflutter  LABS K+ 5.3 BUN/Creat 34/1.49 poc Trop 0.00 WBC 6.6 H/H 12/39 Plts 219  cxr with R perihilar and L base opacity  Past Medical History:  Diagnosis Date  . CKD (chronic kidney disease)    stage 3   . Diabetes mellitus without complication (HCC)    pre-diabetes   . Hypertension     History reviewed. No pertinent surgical history.     Inpatient Medications: Scheduled Meds: . adenosine (ADENOCARD) IV  12 mg Intravenous Once  . adenosine (ADENOCARD) IV  6 mg Intravenous Once  . metoprolol tartrate  12.5 mg Oral BID    Continuous Infusions:  PRN Meds:   Allergies:    Allergies  Allergen Reactions  . Amoxicillin Swelling  . Other   . Tuberculin Ppd     Social History:   Social History   Socioeconomic History  . Marital status: Married    Spouse name: Not on file  . Number of children: Not on file  . Years of education: Not on file  . Highest education level: Not on file  Occupational History  . Not on file  Social Needs  . Financial resource strain: Not on file  . Food insecurity:    Worry: Not on file    Inability: Not on file  . Transportation needs:    Medical: Not on file    Non-medical: Not on file  Tobacco Use  . Smoking status: Never Smoker  . Smokeless tobacco: Never Used  Substance and Sexual Activity  . Alcohol use: Not on file  . Drug use: Never  . Sexual activity: Not on file  Lifestyle  . Physical activity:    Days per week: Not on file    Minutes per session: Not on file  . Stress: Not on file  Relationships  . Social connections:    Talks on phone: Not on file    Gets together: Not on file    Attends religious service: Not on  file    Active member of club or organization: Not on file    Attends meetings of clubs or organizations: Not on file    Relationship status: Not on file  . Intimate partner violence:    Fear of current or ex partner: Not on file    Emotionally abused: Not on file    Physically abused: Not on file    Forced sexual activity: Not on file  Other Topics Concern  . Not on file  Social History Narrative  . Not on file    Family History:   Family History  Problem Relation Age of Onset  . Heart failure Mother   . Stroke Father   . Diabetes Father   . Stroke Sister      ROS:  Please see the history of present illness.  All other ROS reviewed and negative.     Physical Exam/Data:   Vitals:   06/08/17 1430 06/08/17 1445 06/08/17 1452 06/08/17 1605  BP: 132/84 132/79  114/80  Pulse: (!) 113 96    Resp: 15 (!) 22    Temp:       TempSrc:      SpO2: 99% 99%    Weight:   149 lb (67.6 kg)   Height:   5\' 8"  (1.727 m)    No intake or output data in the 24 hours ending 06/08/17 1651 Filed Weights   06/08/17 1452  Weight: 149 lb (67.6 kg)   Body mass index is 22.66 kg/m.  General:  Well nourished, thin, well developed, in no acute distress HEENT: normal Lymph: no adenopathy Neck: no JVD Endocrine:  No thryomegaly Vascular: No carotid bruits Cardiac:  RRR; tachycardic, no murmurs, gallops or rubs Lungs:  CTA b/l, no wheezing, rhonchi or rales  Abd: soft, nontender Ext: no edema Musculoskeletal:  No deformities,age appropriate atrophy Skin: warm and dry  Neuro:   No gross focal abnormalities noted Psych:  Normal affect, very plesent  EKG:  The EKG was personally reviewed and demonstrates:   Atypical AFlutter 2:1, 119bpm Telemetry:  Telemetry was personally reviewed and demonstrates:   Remains tachycardic, 115-120, post adenosine remained slowed, did not convert  Relevant CV Studies:  Echo is ordered/pending  Laboratory Data:  Chemistry Recent Labs  Lab 06/08/17 1148  NA 137  K 5.3*  CL 106  CO2 19*  GLUCOSE 100*  BUN 34*  CREATININE 1.49*  CALCIUM 9.8  GFRNONAA 32*  GFRAA 37*  ANIONGAP 12    No results for input(s): PROT, ALBUMIN, AST, ALT, ALKPHOS, BILITOT in the last 168 hours. Hematology Recent Labs  Lab 06/08/17 1148  WBC 6.6  RBC 4.23  HGB 12.8  HCT 39.6  MCV 93.6  MCH 30.3  MCHC 32.3  RDW 13.5  PLT 219   Cardiac EnzymesNo results for input(s): TROPONINI in the last 168 hours.  Recent Labs  Lab 06/08/17 1158  TROPIPOC 0.00    BNPNo results for input(s): BNP, PROBNP in the last 168 hours.  DDimer No results for input(s): DDIMER in the last 168 hours.  Radiology/Studies:   Dg Chest 2 View Result Date: 06/08/2017 CLINICAL DATA:  Tachycardia EXAM: CHEST - 2 VIEW COMPARISON:  06/15/2015 FINDINGS: There is patchy density at the left base and right perihilar lung.  Chronic patchy opacity at the apices, greater on the right. This apical opacities attributed to scarring based on 2015 chest CT. No Kerley lines, effusion, or pneumothorax. Normal heart size. Mild aortic tortuosity. IMPRESSION: 1. Patchy right  perihilar and left base opacity compatible with infection in the appropriate clinical setting. If infectious symptoms, followup PA and lateral chest X-ray is recommended in 3-4 weeks following trial of antibiotic therapy to ensure resolution. If no infectious symptoms, a CT could further evaluate. 2. Biapical pleural scarring. Electronically Signed   By: Monte Fantasia M.D.   On: 06/08/2017 13:34    Assessment and Plan:   1. Atypical Aflutter     CHA2DS2Vasc is 3     Agree with rate control with BB started and a/c w/Eliquis ordered  Will need TEE if DCCV needed in the short term Check echo   2. HTN     Looks OK, agree with current    For questions or updates, please contact [facility] Please consult [email or web address] for contact info under Cardiology/STEMI.   Signed, Baldwin Jamaica, PA-C  06/08/2017 4:51 PM   EP attending  Patient seen and examined.  Agree with the findings as noted above.  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  We are consulted by Dr. Johnsie Cancel for additional recommendation.  In the emergency department she was given intravenous adenosine which clearly demonstrated her atypical atrial flutter.  The flutter waves are positive in lead V1 and isoelectric the positive in lead II, III and aVF.  She is minimally symptomatic.  She has been started on systemic anticoagulation which she has tolerated.  She will be placed on beta-blocker therapy and observed overnight.  Because she is so minimally symptomatic, I would anticipate workup as an outpatient once we have slowed her heart  rates down adequately.  Crissie Sickles, MD

## 2017-06-08 NOTE — ED Notes (Signed)
Pt ambulated to room from waiting room, tolerated well. 

## 2017-06-08 NOTE — ED Notes (Signed)
Pt ambulated to restroom located beside room, tolerated well. 

## 2017-06-08 NOTE — ED Notes (Signed)
Pt requested to get up and walk around; explained current active order for bedrest, attending paged.
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
## 2017-06-08 NOTE — ED Notes (Signed)
Bedrest order clarified, may use bedside commode

## 2017-06-09 ENCOUNTER — Observation Stay (HOSPITAL_BASED_OUTPATIENT_CLINIC_OR_DEPARTMENT_OTHER): Payer: Medicare Other

## 2017-06-09 ENCOUNTER — Observation Stay (HOSPITAL_COMMUNITY): Payer: Medicare Other

## 2017-06-09 DIAGNOSIS — Z7982 Long term (current) use of aspirin: Secondary | ICD-10-CM | POA: Diagnosis not present

## 2017-06-09 DIAGNOSIS — I129 Hypertensive chronic kidney disease with stage 1 through stage 4 chronic kidney disease, or unspecified chronic kidney disease: Secondary | ICD-10-CM | POA: Diagnosis not present

## 2017-06-09 DIAGNOSIS — E1122 Type 2 diabetes mellitus with diabetic chronic kidney disease: Secondary | ICD-10-CM | POA: Diagnosis not present

## 2017-06-09 DIAGNOSIS — I10 Essential (primary) hypertension: Secondary | ICD-10-CM | POA: Diagnosis not present

## 2017-06-09 DIAGNOSIS — I484 Atypical atrial flutter: Principal | ICD-10-CM

## 2017-06-09 DIAGNOSIS — J189 Pneumonia, unspecified organism: Secondary | ICD-10-CM | POA: Diagnosis not present

## 2017-06-09 DIAGNOSIS — R000 Tachycardia, unspecified: Secondary | ICD-10-CM | POA: Diagnosis not present

## 2017-06-09 DIAGNOSIS — I340 Nonrheumatic mitral (valve) insufficiency: Secondary | ICD-10-CM | POA: Diagnosis not present

## 2017-06-09 DIAGNOSIS — Z833 Family history of diabetes mellitus: Secondary | ICD-10-CM | POA: Diagnosis not present

## 2017-06-09 DIAGNOSIS — Z79899 Other long term (current) drug therapy: Secondary | ICD-10-CM | POA: Diagnosis not present

## 2017-06-09 DIAGNOSIS — J180 Bronchopneumonia, unspecified organism: Secondary | ICD-10-CM | POA: Diagnosis not present

## 2017-06-09 DIAGNOSIS — Z887 Allergy status to serum and vaccine status: Secondary | ICD-10-CM | POA: Diagnosis not present

## 2017-06-09 DIAGNOSIS — E119 Type 2 diabetes mellitus without complications: Secondary | ICD-10-CM | POA: Diagnosis not present

## 2017-06-09 DIAGNOSIS — Z880 Allergy status to penicillin: Secondary | ICD-10-CM | POA: Diagnosis not present

## 2017-06-09 DIAGNOSIS — R911 Solitary pulmonary nodule: Secondary | ICD-10-CM | POA: Diagnosis not present

## 2017-06-09 DIAGNOSIS — I471 Supraventricular tachycardia: Secondary | ICD-10-CM | POA: Diagnosis not present

## 2017-06-09 DIAGNOSIS — N184 Chronic kidney disease, stage 4 (severe): Secondary | ICD-10-CM | POA: Diagnosis not present

## 2017-06-09 DIAGNOSIS — E785 Hyperlipidemia, unspecified: Secondary | ICD-10-CM | POA: Diagnosis not present

## 2017-06-09 LAB — LIPID PANEL
Cholesterol: 111 mg/dL (ref 0–200)
HDL: 60 mg/dL (ref >40)
LDL CALC: 33 mg/dL (ref 0–99)
TRIGLYCERIDES: 88 mg/dL (ref <150)
Total CHOL/HDL Ratio: 1.9 RATIO
VLDL: 18 mg/dL (ref 0–40)

## 2017-06-09 LAB — BASIC METABOLIC PANEL
ANION GAP: 8 (ref 5–15)
BUN: 26 mg/dL — ABNORMAL HIGH (ref 6–20)
CO2: 21 mmol/L — ABNORMAL LOW (ref 22–32)
Calcium: 9 mg/dL (ref 8.9–10.3)
Chloride: 110 mmol/L (ref 101–111)
Creatinine, Ser: 1.3 mg/dL — ABNORMAL HIGH (ref 0.44–1.00)
GFR calc Af Amer: 44 mL/min — ABNORMAL LOW (ref >60)
GFR, EST NON AFRICAN AMERICAN: 38 mL/min — AB (ref >60)
GLUCOSE: 92 mg/dL (ref 65–99)
POTASSIUM: 4.6 mmol/L (ref 3.5–5.1)
SODIUM: 139 mmol/L (ref 135–145)

## 2017-06-09 LAB — CBC
HEMATOCRIT: 34 % — AB (ref 36.0–46.0)
HEMOGLOBIN: 11 g/dL — AB (ref 12.0–15.0)
MCH: 29.6 pg (ref 26.0–34.0)
MCHC: 32.4 g/dL (ref 30.0–36.0)
MCV: 91.4 fL (ref 78.0–100.0)
Platelets: 195 10*3/uL (ref 150–400)
RBC: 3.72 MIL/uL — ABNORMAL LOW (ref 3.87–5.11)
RDW: 13.2 % (ref 11.5–15.5)
WBC: 4.8 10*3/uL (ref 4.0–10.5)

## 2017-06-09 LAB — ECHOCARDIOGRAM COMPLETE
Height: 68 in
Weight: 2384 oz

## 2017-06-09 LAB — TROPONIN I: Troponin I: <0.03 ng/mL (ref <0.03)

## 2017-06-09 MED ORDER — DIGOXIN 125 MCG PO TABS: 0.1250 mg | ORAL_TABLET | Freq: Every day | ORAL | Status: DC

## 2017-06-09 MED ORDER — DIGOXIN 125 MCG PO TABS
0.1250 mg | ORAL_TABLET | ORAL | Status: DC
  Administered 2017-06-09 – 2017-06-10 (×2): 0.125 mg via ORAL
  Filled 2017-06-09 (×2): qty 1

## 2017-06-09 MED ORDER — LISINOPRIL 2.5 MG PO TABS
2.5000 mg | ORAL_TABLET | Freq: Every day | ORAL | Status: DC
  Administered 2017-06-09 – 2017-06-10 (×2): 2.5 mg via ORAL
  Filled 2017-06-09 (×2): qty 1

## 2017-06-09 MED ORDER — SODIUM CHLORIDE 0.9 % IV SOLN: INTRAVENOUS | Status: DC

## 2017-06-09 MED ORDER — METOPROLOL TARTRATE 25 MG PO TABS
25.0000 mg | ORAL_TABLET | Freq: Two times a day (BID) | ORAL | Status: DC
  Administered 2017-06-09 – 2017-06-10 (×3): 25 mg via ORAL
  Filled 2017-06-09 (×3): qty 1

## 2017-06-09 NOTE — Progress Notes (Addendum)
Progress Note  Patient Name: Darlene Wang Date of Encounter: 06/09/2017  Primary Cardiologist: [name], MD   Subjective   No complaints, difficult sleep in the hospital  Inpatient Medications    Scheduled Meds: . apixaban  2.5 mg Oral BID  . atorvastatin  40 mg Oral QHS  . calcium-vitamin D  1 tablet Oral Daily  . cholecalciferol  1,000 Units Oral Daily  . digoxin  0.125 mg Oral Daily  . estrogens (conjugated)  0.45 mg Oral QHS  . lisinopril  2.5 mg Oral Daily  . metoprolol tartrate  25 mg Oral BID  . multivitamin with minerals  1 tablet Oral Daily   Continuous Infusions:  PRN Meds: acetaminophen, ondansetron (ZOFRAN) IV   Vital Signs    Vitals:   06/09/17 0400 06/09/17 0500 06/09/17 0600 06/09/17 0730  BP: 110/74 108/73 106/65   Pulse: (!) 115 (!) 117 (!) 117 (!) 117  Resp: 15 14 18 17   Temp:      TempSrc:      SpO2: 95% 95% 94% 97%  Weight:      Height:        Intake/Output Summary (Last 24 hours) at 06/09/2017 0825 Last data filed at 06/09/2017 1601 Gross per 24 hour  Intake 222 ml  Output -  Net 222 ml   Filed Weights   06/08/17 1452  Weight: 149 lb (67.6 kg)    Telemetry    AFlutter 110's - Personally Reviewed  ECG    No new EKGs - Personally Reviewed  Physical Exam   GEN: No acute distress.   Neck: No JVD Cardiac: RRR, no murmurs, rubs, or gallops.  Respiratory: CTA b/l. GI: Soft, nontender, non-distended  MS: No edema; No deformity. Neuro:  Nonfocal  Psych: Normal affect   Labs    Chemistry Recent Labs  Lab 06/08/17 1148 06/08/17 1931 06/09/17 0432  NA 137  --  139  K 5.3*  --  4.6  CL 106  --  110  CO2 19*  --  21*  GLUCOSE 100*  --  92  BUN 34*  --  26*  CREATININE 1.49*  --  1.30*  CALCIUM 9.8  --  9.0  PROT  --  6.8  --   ALBUMIN  --  3.4*  --   AST  --  28  --   ALT  --  16  --   ALKPHOS  --  67  --   BILITOT  --  0.4  --   GFRNONAA 32*  --  38*  GFRAA 37*  --  44*  ANIONGAP 12  --  8      Hematology Recent Labs  Lab 06/08/17 1148 06/09/17 0432  WBC 6.6 4.8  RBC 4.23 3.72*  HGB 12.8 11.0*  HCT 39.6 34.0*  MCV 93.6 91.4  MCH 30.3 29.6  MCHC 32.3 32.4  RDW 13.5 13.2  PLT 219 195    Cardiac Enzymes Recent Labs  Lab 06/08/17 1931 06/09/17 0432  TROPONINI <0.03 <0.03    Recent Labs  Lab 06/08/17 1158  TROPIPOC 0.00     BNPNo results for input(s): BNP, PROBNP in the last 168 hours.   DDimer No results for input(s): DDIMER in the last 168 hours.   Radiology    Dg Chest 2 View Result Date: 06/08/2017 CLINICAL DATA:  Tachycardia EXAM: CHEST - 2 VIEW COMPARISON:  06/15/2015 FINDINGS: There is patchy density at the left base and right perihilar lung.  Chronic patchy opacity at the apices, greater on the right. This apical opacities attributed to scarring based on 2015 chest CT. No Kerley lines, effusion, or pneumothorax. Normal heart size. Mild aortic tortuosity. IMPRESSION: 1. Patchy right perihilar and left base opacity compatible with infection in the appropriate clinical setting. If infectious symptoms, followup PA and lateral chest X-ray is recommended in 3-4 weeks following trial of antibiotic therapy to ensure resolution. If no infectious symptoms, a CT could further evaluate. 2. Biapical pleural scarring. Electronically Signed   By: Monte Fantasia M.D.   On: 06/08/2017 13:34    Cardiac Studies   Echo is pending  Patient Profile     83 y.o. female  with a hx of DM, CKD(III), HTN, admitted with new AFlutter  Assessment & Plan    1. Atypical Aflutter     CHA2DS2Vasc is 3     d/w Dr. Lovena Le, agree with 2.5 Eliquis,      Rates remain uncontrolled, will increase BB and reduce ACE dosing, add dig 0.125mg  daily M-F only  Echo is pending, will await this, anticipate discharge today once resulted for final med recommendations   2. HTN     Changes as above  3. 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      Signed, Baldwin Jamaica, PA-C  06/09/2017, 8:25 AM    EP Attending  Patient seen and examined. Agree with above. The patient is stable this morning. Her echo is pending. She will have her AV nodal blocking drugs uptitrated, return to our office in a few days for an ECG, and consider DCCV after she has been therapeutically anti-coagulated. Her CXR demonstrates a nodule for which a CT scan has been recommended.  Mikle Bosworth.D.

## 2017-06-09 NOTE — ED Notes (Signed)
Admit MD at bedside

## 2017-06-09 NOTE — ED Notes (Signed)
Pt received diet tray.

## 2017-06-09 NOTE — Progress Notes (Signed)
Progress Note  Patient Name: Darlene Wang Date of Encounter: 06/09/2017  Primary Cardiologist: [name], MD   Subjective   Still with palpitations   Inpatient Medications    Scheduled Meds: . apixaban  2.5 mg Oral BID  . atorvastatin  40 mg Oral QHS  . calcium-vitamin D  1 tablet Oral Daily  . cholecalciferol  1,000 Units Oral Daily  . digoxin  0.125 mg Oral Once per day on Mon Tue Wed Thu Fri  . estrogens (conjugated)  0.45 mg Oral QHS  . lisinopril  2.5 mg Oral Daily  . metoprolol tartrate  25 mg Oral BID  . multivitamin with minerals  1 tablet Oral Daily   Continuous Infusions:  PRN Meds: acetaminophen, ondansetron (ZOFRAN) IV   Vital Signs    Vitals:   06/09/17 0600 06/09/17 0700 06/09/17 0730 06/09/17 0830  BP: 106/65 99/66    Pulse: (!) 117 (!) 116 (!) 117 (!) 122  Resp: 18 17 17 20   Temp:      TempSrc:      SpO2: 94% 96% 97% 100%  Weight:      Height:        Intake/Output Summary (Last 24 hours) at 06/09/2017 0852 Last data filed at 06/09/2017 0814 Gross per 24 hour  Intake 222 ml  Output -  Net 222 ml   Filed Weights   06/08/17 1452  Weight: 149 lb (67.6 kg)    Telemetry    Flutter rate 120 06/09/2017  - Personally Reviewed  ECG    Flutter low voltage  - Personally Reviewed  Physical Exam   GEN: No acute distress.   Neck: No JVD Cardiac: RRR, no murmurs, rubs, or gallops.  Respiratory: Clear to auscultation bilaterally. GI: Soft, nontender, non-distended  MS: No edema; No deformity. Neuro:  Nonfocal  Psych: Normal affect   Labs    Chemistry Recent Labs  Lab 06/08/17 1148 06/08/17 1931 06/09/17 0432  NA 137  --  139  K 5.3*  --  4.6  CL 106  --  110  CO2 19*  --  21*  GLUCOSE 100*  --  92  BUN 34*  --  26*  CREATININE 1.49*  --  1.30*  CALCIUM 9.8  --  9.0  PROT  --  6.8  --   ALBUMIN  --  3.4*  --   AST  --  28  --   ALT  --  16  --   ALKPHOS  --  67  --   BILITOT  --  0.4  --   GFRNONAA 32*  --  38*  GFRAA  37*  --  44*  ANIONGAP 12  --  8     Hematology Recent Labs  Lab 06/08/17 1148 06/09/17 0432  WBC 6.6 4.8  RBC 4.23 3.72*  HGB 12.8 11.0*  HCT 39.6 34.0*  MCV 93.6 91.4  MCH 30.3 29.6  MCHC 32.3 32.4  RDW 13.5 13.2  PLT 219 195    Cardiac Enzymes Recent Labs  Lab 06/08/17 1931 06/09/17 0432  TROPONINI <0.03 <0.03    Recent Labs  Lab 06/08/17 1158  TROPIPOC 0.00     BNPNo results for input(s): BNP, PROBNP in the last 168 hours.   DDimer No results for input(s): DDIMER in the last 168 hours.   Radiology    Dg Chest 2 View  Result Date: 06/08/2017 CLINICAL DATA:  Tachycardia EXAM: CHEST - 2 VIEW COMPARISON:  06/15/2015 FINDINGS:  There is patchy density at the left base and right perihilar lung. Chronic patchy opacity at the apices, greater on the right. This apical opacities attributed to scarring based on 2015 chest CT. No Kerley lines, effusion, or pneumothorax. Normal heart size. Mild aortic tortuosity. IMPRESSION: 1. Patchy right perihilar and left base opacity compatible with infection in the appropriate clinical setting. If infectious symptoms, followup PA and lateral chest X-ray is recommended in 3-4 weeks following trial of antibiotic therapy to ensure resolution. If no infectious symptoms, a CT could further evaluate. 2. Biapical pleural scarring. Electronically Signed   By: Monte Fantasia M.D.   On: 06/08/2017 13:34    Cardiac Studies   Echo pending   Patient Profile     83 y.o. female with a week of tachycardia. Noted to be in atypical flutter in ER Confirmed with adenosine . CRF secondary to HTN and DM  On dose adjusted Eliquis. Poor rate control with soft BP Tentative TEE/DCC 06/10/17    Assessment & Plan    Flutter:  Continue beta blocker add digoxin echo pending Discussed risks and benefits Of TEE/DCC Have schedule for 9:00 am tomorrow since rate control has been sub optimal And patient still symptomatic     For questions or updates, please  contact [facility] Please consult [email or web address] for contact info under Cardiology/STEMI.      Signed, [name], MD  06/09/2017, 8:52 AM

## 2017-06-09 NOTE — Progress Notes (Signed)
  Echocardiogram 2D Echocardiogram has been performed.  Darlene Wang 06/09/2017, 9:56 AM

## 2017-06-09 NOTE — ED Notes (Signed)
Pt returns from Echo, husband at bedside

## 2017-06-09 NOTE — ED Notes (Signed)
Attempted Report 

## 2017-06-09 NOTE — ED Notes (Signed)
Pt transported for echocardiogram

## 2017-06-09 NOTE — ED Notes (Signed)
Patient transported to CT 

## 2017-06-10 ENCOUNTER — Encounter (HOSPITAL_COMMUNITY)
Admission: EM | Disposition: A | Discharge status: Home / Self Care | Payer: Self-pay | Source: Home / Self Care | Attending: Cardiovascular Disease

## 2017-06-10 ENCOUNTER — Inpatient Hospital Stay (HOSPITAL_COMMUNITY): Payer: Medicare Other

## 2017-06-10 ENCOUNTER — Inpatient Hospital Stay (HOSPITAL_COMMUNITY): Payer: Medicare Other | Admitting: Anesthesiology

## 2017-06-10 ENCOUNTER — Encounter (HOSPITAL_COMMUNITY): Payer: Self-pay | Admitting: Anesthesiology

## 2017-06-10 DIAGNOSIS — I10 Essential (primary) hypertension: Secondary | ICD-10-CM

## 2017-06-10 DIAGNOSIS — R7303 Prediabetes: Secondary | ICD-10-CM

## 2017-06-10 DIAGNOSIS — I340 Nonrheumatic mitral (valve) insufficiency: Secondary | ICD-10-CM

## 2017-06-10 DIAGNOSIS — Z9889 Other specified postprocedural states: Secondary | ICD-10-CM

## 2017-06-10 DIAGNOSIS — Z9289 Personal history of other medical treatment: Secondary | ICD-10-CM

## 2017-06-10 DIAGNOSIS — R911 Solitary pulmonary nodule: Secondary | ICD-10-CM

## 2017-06-10 DIAGNOSIS — N184 Chronic kidney disease, stage 4 (severe): Secondary | ICD-10-CM

## 2017-06-10 DIAGNOSIS — I471 Supraventricular tachycardia: Secondary | ICD-10-CM

## 2017-06-10 HISTORY — PX: CARDIOVERSION: SHX1299

## 2017-06-10 HISTORY — PX: TEE WITHOUT CARDIOVERSION: SHX5443

## 2017-06-10 SURGERY — ECHOCARDIOGRAM, TRANSESOPHAGEAL
Anesthesia: Moderate Sedation

## 2017-06-10 SURGERY — ECHOCARDIOGRAM, TRANSESOPHAGEAL
Anesthesia: General

## 2017-06-10 MED ORDER — EPHEDRINE SULFATE 50 MG/ML IJ SOLN
INTRAMUSCULAR | Status: DC | PRN
  Administered 2017-06-10: 5 mg via INTRAVENOUS

## 2017-06-10 MED ORDER — PROPOFOL 500 MG/50ML IV EMUL
INTRAVENOUS | Status: DC | PRN
  Administered 2017-06-10: 200 ug/kg/min via INTRAVENOUS

## 2017-06-10 MED ORDER — APIXABAN 2.5 MG PO TABS: 2.5000 mg | ORAL_TABLET | Freq: Two times a day (BID) | ORAL | 3 refills | Status: DC

## 2017-06-10 MED ORDER — DIGOXIN 125 MCG PO TABS: ORAL_TABLET | ORAL | 3 refills | Status: DC

## 2017-06-10 MED ORDER — LISINOPRIL 2.5 MG PO TABS: 2.5000 mg | ORAL_TABLET | Freq: Every day | ORAL | 3 refills | Status: DC

## 2017-06-10 MED ORDER — LACTATED RINGERS IV SOLN
INTRAVENOUS | Status: DC | PRN
  Administered 2017-06-10: 08:00:00 via INTRAVENOUS

## 2017-06-10 MED ORDER — LIDOCAINE HCL (CARDIAC) 20 MG/ML IV SOLN
INTRAVENOUS | Status: DC | PRN
  Administered 2017-06-10 (×2): 50 mg via INTRAVENOUS

## 2017-06-10 MED ORDER — PHENYLEPHRINE HCL 10 MG/ML IJ SOLN
INTRAMUSCULAR | Status: DC | PRN
  Administered 2017-06-10: 80 ug via INTRAVENOUS

## 2017-06-10 MED ORDER — METOPROLOL TARTRATE 25 MG PO TABS: 25.0000 mg | ORAL_TABLET | Freq: Two times a day (BID) | ORAL | 3 refills | Status: DC

## 2017-06-10 NOTE — Discharge Summary (Signed)
Discharge Summary    Patient ID: Darlene Wang,  MRN: [id number], DOB/AGE: 05-24-1934 83 y.o.  Admit date: 06/08/2017 Discharge date: 06/10/2017  Primary Care Provider: Leighton Wang Primary Cardiologist: Darlene Rouge, MD  Discharge Diagnoses    Active Problems:   Tachycardia   Atrial tachycardia (HCC)   HTN (hypertension)   Pulmonary nodule   History of cardioversion   CKD (chronic kidney disease) stage 4, GFR 15-29 ml/min (HCC)   Pre-diabetes  Allergies Allergies  Allergen Reactions  . Amoxicillin Swelling  . Other   . Tuberculin Ppd    Diagnostic Studies/Procedures    Echocardiogram 06/09/17: Study Conclusions - Left ventricle: The cavity size was normal. Wall thickness was increased in a pattern of mild LVH. Systolic function was normal. The estimated ejection fraction was in the range of 60% to 65%. Wall motion was normal; there were no regional wall motion abnormalities. The study is not technically sufficient to allow evaluation of LV diastolic function. - Aortic valve: There was trivial regurgitation. - Mitral valve: There was mild regurgitation.  Impressions: - Normal LV function; mild LVH; trace AI; mild MR and TR.   TEE with DCCV: 06/10/17 Successful Aflutter>>NSR Results: Normal LV size and function with EF 60-65% Normal RV size and function Normal RA Mildly dilated LA with spontaneous echo contrast.  There is no thrombus in LAA or LA.  The LAA emptying velocity is mildly reduced at 35. Normal TV with mild to moderate TR Normal PV with mild PR Normal MV with mild MR Normal trileaflet AV with mild AR Normal interatrial septum with no evidence of shunt by colorflow dopper  Normal thoracic and ascending aorta.  History of Present Illness     Darlene Wang is an 83 year old female with a history of DM 2, CKD-3 and HTN who presented to [facility] with heart rates in the 120s concerning for new atrial fibrillation/flutter.  Initially, the patient was  seen by her PCP on 06/08/2017 with feelings of fast heart rate and palpitations for the last week.  An EKG was performed which revealed new T-wave inversions in V2 through V3 with a rhythm reported as sinus tachycarida however, appeared to be atrial tach with block upon review. Subsequently, the patient was referred to [facility] for further evaluation.   In the emergency department, the patient was essentially asymptomatic, though being a retired Darlene Wang, had been keeping an eye on her BP and noted that for about 2 weeks that her HR was fast. She did notice over the last several days that she would feel her heart beat stronger and faster at night while trying to lay down to sleep. She had no symptoms of chest pain or chest discomfort, denied SOB, dizziness or syncope. She is otherwise very active, exercises routinely with water aerobics and denies any kind of exertional intolerances at baseline. Outside of her BP machine noting fast rates and nighttime palpitations, she feels very well and considers herself pretty healthy.   Wang Course     In the ED, her  troponin level was 0.00 (with subsequent negative trops at <0.03, <0.03) and her creatinine was 1.49. Upon arrival, her K+ was elevated at 5.3. This was resolved at follow up BMET. An additional EKG was performed which showed atrial tachycardia and resolution of prior reported T-wave changes obtained in the emergency department. Chest x-ray was performed with patchy right perihilar and left base opacities compatible with infection in the appropriate clinical setting with follow-up PA and lateral chest  x-ray recommended in 3-4 weeks following trial of antibiotic therapy to ensure resolution is or possible CT for further evaluation. This was completed with results below on 06/09/2017.   She was given 6 mg intravenous adenosine which clearly demonstrated her atypical atrial flutter. Unfortunately, her rate sped back up to 120 after injection. She was admitted  with plans to initially attempt to slow her rate with beta-blocker therapy and check an echocardiogram to evaluate for underlying etiology and for possible DCCV.  She was started on systemic anticoagulation at that time.  EP was consulted on 06/08/2017. On 06/09/2017 patient remained with uncontrolled rates.  At that time EP increased beta-blocker and reduced ACE dosing, along with the addition of digoxin 0.125 mg daily Monday through Friday only.  On 06/10/2017 patient underwent TEE/DCCV with successful cardioversion from atrial flutter to normal sinus rhythm.   Wang problems: Atypical Aflutter with rapid ventricular response: -Successful TEE/DCCV for atrial flutter to normal sinus rhythm -Rate control with beta-blocker and low-dose Eliquis -Echocardiogram with EF 60-65%, mildly dilated LA with no thrombus in LAA or LA. Normal RA with trileaflet AV with mild AR. -A-flutter is atypical if she fails DCCV, will need consideration for ablation per EP MD note -CHA2DS2Vasc is 3 -Eliquis 2.5 mg bid dose secondary to age and creatinine -Digoxin 0.125 mg p.o. recommended from EP Monday through Friday only -Lopressor 25 mg p.o.BID -Lisinopril 2.5 mg p.o. daily   HTN: -Stable, 106/59, 97/58, 126/85, 100/62 -Lopressor 25 mg PO BID -Lisinopril 2.5 mg PO QD  Pulmonary nodule/opacties found on initial CXR: -Follow-up CT without contrast secondary to impaired renal function was completed on 06/09/2017 which revealed new patchy nodular.  Bronchovascular foci with consolidation throughout both lungs, most compatible with multilobar bronchopneumonia with recommendations for follow-up PA and lateral posttreatment chest radiographs in 4-6 weeks.  Previously visualized scattered small solid pulmonary nodules on 09/02/2013 chest CT are stable and considered benign. -No pulmonary symptoms or symptoms of illness throughout her [facility] stay -Follow-up plan discussed with patient per MD  Chronic kidney disease  stage III: -Cr, 1.49 on admission, 1.30 on day of discharge -We will follow with outpatient BMET  Consultants: EP  She was seen and examined by Dr. Johnsie Cancel who feels she is stable and ready for discharge on 06/10/2017 after successful cardioversion to normal sinus rhythm.  Follow-up appointment has been made.  Discharge Vitals Blood pressure 100/62, pulse 67, temperature 97.9 F (36.6 C), temperature source Oral, resp. rate 15, height 5\' 8"  (1.727 m), weight 147 lb 1.6 oz (66.7 kg), SpO2 98 %.  Filed Weights   06/08/17 1452 06/09/17 1500 06/10/17 0635  Weight: 149 lb (67.6 kg) 148 lb 8 oz (67.4 kg) 147 lb 1.6 oz (66.7 kg)    Labs & Radiologic Studies    CBC Recent Labs    06/08/17 1148 06/09/17 0432  WBC 6.6 4.8  HGB 12.8 11.0*  HCT 39.6 34.0*  MCV 93.6 91.4  PLT 219 702   Basic Metabolic Panel Recent Labs    06/08/17 1148 06/08/17 1931 06/09/17 0432  NA 137  --  139  K 5.3*  --  4.6  CL 106  --  110  CO2 19*  --  21*  GLUCOSE 100*  --  92  BUN 34*  --  26*  CREATININE 1.49*  --  1.30*  CALCIUM 9.8  --  9.0  MG  --  2.0  --    Liver Function Tests Recent Labs    06/08/17  1931  AST 28  ALT 16  ALKPHOS 67  BILITOT 0.4  PROT 6.8  ALBUMIN 3.4*   Cardiac Enzymes Recent Labs    06/08/17 1931 06/09/17 0432  TROPONINI <0.03 <0.03   Hemoglobin A1C Recent Labs    06/08/17 1931  HGBA1C 5.7*   Fasting Lipid Panel Recent Labs    06/09/17 0432  CHOL 111  HDL 60  LDLCALC 33  TRIG 88  CHOLHDL 1.9   Thyroid Function Tests Recent Labs    06/08/17 1931  TSH 3.144   _____________  Dg Chest 2 View  Result Date: 06/08/2017 CLINICAL DATA:  Tachycardia EXAM: CHEST - 2 VIEW COMPARISON:  06/15/2015 FINDINGS: There is patchy density at the left base and right perihilar lung. Chronic patchy opacity at the apices, greater on the right. This apical opacities attributed to scarring based on 2015 chest CT. No Kerley lines, effusion, or pneumothorax. Normal heart  size. Mild aortic tortuosity. IMPRESSION: 1. Patchy right perihilar and left base opacity compatible with infection in the appropriate clinical setting. If infectious symptoms, followup PA and lateral chest X-ray is recommended in 3-4 weeks following trial of antibiotic therapy to ensure resolution. If no infectious symptoms, a CT could further evaluate. 2. Biapical pleural scarring. Electronically Signed   By: Monte Fantasia M.D.   On: 06/08/2017 13:34   Ct Chest Wo Contrast  Result Date: 06/09/2017 CLINICAL DATA:  Bilateral lung opacities on chest radiograph performed for tachycardia. Nonsmoker. EXAM: CT CHEST WITHOUT CONTRAST TECHNIQUE: Multidetector CT imaging of the chest was performed following the standard protocol without IV contrast. COMPARISON:  Chest radiograph from one day prior. 09/02/2013 chest CT. FINDINGS: Cardiovascular: Normal heart size. No significant pericardial fluid/thickening. Atherosclerotic nonaneurysmal thoracic aorta. Normal caliber pulmonary arteries. Mediastinum/Nodes: No discrete thyroid nodules. Unremarkable esophagus. No pathologically enlarged axillary, mediastinal or gross hilar lymph nodes, noting limited sensitivity for the detection of hilar adenopathy on this noncontrast study. Lungs/Pleura: No pneumothorax. No pleural effusion. There are numerous new patchy nodular peribronchovascular foci of consolidation scattered throughout both lungs involving all of the lung lobes, with representative 2.2 x 1.9 cm basilar right lower lobe (series 8/image 128) and anteromedial left upper lobe 2.7 x 2.6 cm (series 8/image 41) nodular foci of peribronchovascular consolidation. Bandlike pleural-parenchymal scarring in the posterior biapical lungs is stable. Of the previously visualized small solid pulmonary nodules scattered in both lungs on the 09/02/2013 chest CT are stable, largest 4 mm in the posterior right lower lobe (series 8/image 99), considered benign. Upper abdomen:  Cholelithiasis. Musculoskeletal: No aggressive appearing focal osseous lesions. Mild thoracic spondylosis. IMPRESSION: 1. New patchy nodular peribronchovascular foci of consolidation throughout both lungs, most compatible with a multilobar bronchopneumonia. Recommend follow-up PA and lateral post treatment chest radiographs in 4-6 weeks. 2. Previously visualized scattered small solid pulmonary nodules on the 09/02/2013 chest CT are stable and considered benign. 3. Stable chronic biapical pleural-parenchymal scarring. 4. Cholelithiasis. Aortic Atherosclerosis (ICD10-I70.0). Electronically Signed   By: Ilona Sorrel M.D.   On: 06/09/2017 14:44   Disposition   Pt is being discharged home today in good condition.  Follow-up Plans & Appointments    Follow-up Information    Isaiah Serge, NP Follow up on 06/26/2017.   Specialties:  Cardiology, Radiology Why:  Your follow-up appointment is on 06/26/2017 with Cecilie Kicks, NP at 2:30 PM.  Please arrive to your appointment at 2:15 PM.  Thank you. Contact information: [address] [place] [address] [phone]  Discharge Instructions    Call MD for:  severe uncontrolled pain   Complete by:  As directed    Diet - low sodium heart healthy   Complete by:  As directed    Discharge instructions   Complete by:  As directed    You have been started on a blood thinner called Eliquis. If you notice any bleeding such as blood in stool, black tarry stools, blood in urine, nosebleeds or any other unusual bleeding, call your doctor immediately.  You will take your Digoxin 0.125mg  by mouth once daily on Mondays thru Thursdays only. Not on Saturdays and Sundays   Increase activity slowly   Complete by:  As directed      Discharge Medications   Allergies as of 06/10/2017      Reactions   Amoxicillin Swelling   Other    Tuberculin Ppd       Medication List    STOP taking these medications   aspirin EC 81 MG tablet       TAKE these medications   apixaban 2.5 MG Tabs tablet Commonly known as:  ELIQUIS Take 1 tablet (2.5 mg total) by mouth 2 (two) times daily.   atorvastatin 40 MG tablet Commonly known as:  LIPITOR Take 40 mg by mouth at bedtime.   CALCIUM 600+D 600-200 MG-UNIT Tabs Generic drug:  Calcium Carbonate-Vitamin D Take 600 mg by mouth daily.   cholecalciferol 1000 units tablet Commonly known as:  VITAMIN D Take 1,000 Units by mouth daily.   digoxin 0.125 MG tablet Commonly known as:  LANOXIN Take 1 tab (0.125mg ) by mouth once per day on Monday thru Friday only. Not on Sat or Sun   estrogens (conjugated) 0.45 MG tablet Commonly known as:  PREMARIN Take 0.45 mg by mouth at bedtime.   lisinopril 2.5 MG tablet Commonly known as:  PRINIVIL,ZESTRIL Take 1 tablet (2.5 mg total) by mouth daily. Start taking on:  06/11/2017 What changed:    medication strength  how much to take  when to take this   metoprolol tartrate 25 MG tablet Commonly known as:  LOPRESSOR Take 1 tablet (25 mg total) by mouth 2 (two) times daily.   multivitamin capsule Take 1 capsule by mouth daily.       Outstanding Labs/Studies   Patient will need BMET at Wang follow-up visit  Duration of Discharge Encounter   Greater than 30 minutes including physician time.  SignedKathyrn Drown NP 06/10/2017, 5:03 PM

## 2017-06-10 NOTE — Interval H&P Note (Signed)
History and Physical Interval Note:  06/10/2017 9:11 AM  Darlene Wang  has presented today for surgery, with the diagnosis of atrial flutter  The various methods of treatment have been discussed with the patient and family. After consideration of risks, benefits and other options for treatment, the patient has consented to  Procedure(s): TRANSESOPHAGEAL ECHOCARDIOGRAM (TEE) (N/A) CARDIOVERSION (N/A) as a surgical intervention .  The patient's history has been reviewed, patient examined, no change in status, stable for surgery.  I have reviewed the patient's chart and labs.  Questions were answered to the patient's satisfaction.     Fransico Him

## 2017-06-10 NOTE — CV Procedure (Signed)
    PROCEDURE NOTE:  Procedure:  Transesophageal echocardiogram Operator:  Fransico Him, MD Indications:  Atrial flutter Complications: None  During this procedure the patient is administered a total of Propofol 400 mg to achieve and maintain moderate conscious sedation.  The patient's heart rate, blood pressure, and oxygen saturation are monitored continuously during the procedure by anesthesia.   Results: Normal LV size and function with EF 60-65% Normal RV size and function Normal RA Mildly dilated LA with spontaneous echo contrast.  There is no thrombus in LAA or LA.  The LAA emptying velocity is mildly reduced at 35. Normal TV with mild to moderate TR Normal PV with mild PR Normal MV with mild MR Normal trileaflet AV with mild AR Normal interatrial septum with no evidence of shunt by colorflow dopper  Normal thoracic and ascending aorta.  The patient tolerated the procedure well and went on to DCCV. Signed: Fransico Him, MD [facility]   Electrical Cardioversion Procedure Note SAMARRAH TRANCHINA [id number] 1934/05/10  Procedure: Electrical Cardioversion Indications:  Atrial Flutter  Time Out: Verified patient identification, verified procedure,medications/allergies/relevent history reviewed, required imaging and test results available.  Performed  Procedure Details  The patient was NPO after midnight. Anesthesia was administered at the beside  by Dr.Hatchett.  Cardioversion was done with synchronized biphasic defibrillation with AP pads with 150watts.  The patient converted to normal sinus rhythm. The patient tolerated the procedure well   IMPRESSION:  Successful cardioversion of atrial flutter    Traci Turner 06/10/2017, 9:12 AM

## 2017-06-10 NOTE — Anesthesia Preprocedure Evaluation (Signed)
Anesthesia Evaluation  Patient identified by MRN, date of birth, ID band Patient awake    Reviewed: Allergy & Precautions, NPO status , Patient's Chart, lab work & pertinent test results  Airway Mallampati: I       Dental no notable dental hx. (+) Teeth Intact   Pulmonary neg pulmonary ROS,    Pulmonary exam normal breath sounds clear to auscultation       Cardiovascular hypertension, Pt. on medications Normal cardiovascular exam Rhythm:Regular Rate:Normal     Neuro/Psych negative neurological ROS  negative psych ROS   GI/Hepatic negative GI ROS, Neg liver ROS,   Endo/Other  diabetes  Renal/GU Renal InsufficiencyRenal disease     Musculoskeletal negative musculoskeletal ROS (+)   Abdominal Normal abdominal exam  (+)   Peds  Hematology  (+) Blood dyscrasia, anemia ,   Anesthesia Other Findings Lorea V [facility]ri[facility]  ECHO COMPLETE WO IMAGING ENHANCING AGENT  Order# [id number]  Reading physician: Lelon Perla, MD Ordering physician: Isaiah Serge, NP Study date: 06/09/17 Study Result   Result status: Final result                             *[facility]*                         [address], [place] [address]                            [phone]  ------------------------------------------------------------------- Transthoracic Echocardiography  Patient:    Darlene Wang, Darlene Wang MR #:       [id number] Study Date: 06/09/2017 Gender:     F Age:        83 Height:     172.7 cm Weight:     67.6 kg BSA:        1.8 m^2 Pt. Status: Room:       [place], [place]  PERFORMING   [facility], Inpatient  SONOGRAPHER  Talmage Coin  cc:  ------------------------------------------------------------------- LV EF: 60% -    65%  ------------------------------------------------------------------- Indications:      Atrial flutter 427.32.  ------------------------------------------------------------------- History:   PMH:   Tachycardia.  ------------------------------------------------------------------- Study Conclusions  - Left ventricle: The cavity size was normal. Wall thickness was   increased in a pattern of mild LVH. Systolic function was normal.   The estimated ejection fraction was in the range of 60% to 65%.   Wall motion was normal; there were no regional wall motion   abnormalities. The study is not technically sufficient to allow   evaluation of LV diastolic function. - Aortic valve: There was trivial regurgitation. - Mitral valve: There was mild regurgitation.  Impressions:  - Normal LV function; mild LVH; trace AI; mild MR and TR.  ------------------------------------------------------------------- Study data:  No prior study was available for comparison.  Study status:  Routine.  Procedure:  The patient reported no pain pre or post test. Transthoracic echocardiography. Image quality was adequate.  Study completion:  There were no complications. Transthoracic echocardiography.  M-mode, complete 2D, spectral Doppler, and color Doppler.  Birthdate:  Patient birthdate: 1934-11-01.  Age:  Patient is 83 yr old.  Sex:  Gender: female. BMI: 22.7 kg/m^2.  Blood pressure:     99/66  Patient status: Inpatient.  Study date:  Study date: 06/09/2017. Study time: 09:12 AM.  Location:  Echo laboratory.  -------------------------------------------------------------------  ------------------------------------------------------------------- Left ventricle:  The cavity size was normal. Wall thickness was increased in a pattern of mild LVH. Systolic function was normal. The estimated ejection fraction was in the range of 60% to 65%. Wall motion was normal; there were no regional wall  motion abnormalities. The study is not technically sufficient to allow evaluation of LV diastolic function.  ------------------------------------------------------------------- Aortic valve:   Trileaflet; mildly thickened leaflets. Mobility was not restricted.  Doppler:  Transvalvular velocity was within the normal range. There was no stenosis. There was trivial regurgitation.  ------------------------------------------------------------------- Aorta:  Aortic root: The aortic root was normal in size.  ------------------------------------------------------------------- Mitral valve:   Structurally normal valve.   Mobility was not restricted.  Doppler:  Transvalvular velocity was within the normal range. There was no evidence for stenosis. There was mild regurgitation.    Peak gradient (D): 4 mm Hg.  ------------------------------------------------------------------- Left atrium:  The atrium was normal in size.  ------------------------------------------------------------------- Right ventricle:  The cavity size was normal. Systolic function was normal.  ------------------------------------------------------------------- Pulmonic valve:    Doppler:  Transvalvular velocity was within the normal range. There was no evidence for stenosis. There was trivial regurgitation.  ------------------------------------------------------------------- Tricuspid valve:   Structurally normal valve.    Doppler: Transvalvular velocity was within the normal range. There was mild regurgitation.  ------------------------------------------------------------------- Pulmonary artery:   Systolic pressure was within the normal range.   ------------------------------------------------------------------- Right atrium:  The atrium was normal in size.  ------------------------------------------------------------------- Pericardium:  There was no pericardial  effusion.  ------------------------------------------------------------------- Systemic veins: Inferior vena cava: The vessel was normal in size.  ------------------------------------------------------------------- Measurements   Left ventricle                         Value        Reference  LV ID, ED, PLAX chordal        (L)     31.5  mm     43 - 52  LV ID, ES, PLAX chordal        (L)     22.3  mm     23 - 38  LV fx shortening, PLAX chordal         29    %      >=29  LV PW thickness, ED                    12    mm     ----------  IVS/LV PW ratio, ED                    0.93         <=1.3  Stroke volume, 2D                      46    ml     ----------  Stroke volume/bsa, 2D                  26    ml/m^2 ----------    Ventricular septum  Value        Reference  IVS thickness, ED                      11.2  mm     ----------    LVOT                                   Value        Reference  LVOT ID, S                             19    mm     ----------  LVOT area                              2.84  cm^2   ----------  LVOT peak velocity, S                  89.4  cm/s   ----------  LVOT mean velocity, S                  66.4  cm/s   ----------  LVOT VTI, S                            16.1  cm     ----------    Aorta                                  Value        Reference  Aortic root ID, ED                     31    mm     ----------    Left atrium                            Value        Reference  LA ID, A-P, ES                         35    mm     ----------  LA ID/bsa, A-P                         1.94  cm/m^2 <=2.2  LA volume, ES, 1-p A4C                 34.1  ml     ----------  LA volume/bsa, ES, 1-p A4C             18.9  ml/m^2 ----------  LA volume, ES, 1-p A2C                 40.8  ml     ----------  LA volume/bsa, ES, 1-p A2C             22.6  ml/m^2 ----------    Mitral valve                           Value  Reference  Mitral E-wave peak velocity             101   cm/s   ----------  Mitral deceleration time       (L)     129   ms     150 - 230  Mitral peak gradient, D                4     mm Hg  ----------    Pulmonary arteries                     Value        Reference  PA pressure, S, DP                     29    mm Hg  <=30    Tricuspid valve                        Value        Reference  Tricuspid regurg peak velocity         253   cm/s   ----------  Tricuspid peak RV-RA gradient          26    mm Hg  ----------    Right atrium                           Value        Reference  RA ID, S-I, ES, A4C                    47.9  mm     34 - 49  RA area, ES, A4C                       12.8  cm^2   8.3 - 19.5  RA volume, ES, A/L                     27.4  ml     ----------  RA volume/bsa, ES, A/L                 15.2  ml/m^2 ----------    Systemic veins                         Value        Reference  Estimated CVP                          3     mm Hg  ----------    Right ventricle                        Value        Reference  TAPSE                                  15    mm     ----------  RV pressure, S, DP                     29    mm Hg  <=30  Legend: (L)  and  (H)  mark values outside specified reference range.  -------------------------------------------------------------------  Prepared and Electronically Authenticated by  Kirk Ruths 2019-03-26T12:53:32 [place] for ECHOCARDIOGRAM COMPLETE Patient Information   Patient Name Daria, Mcmeekin Sex Female DOB February 20, 1935 SSN [id number] Reason for Exam  Priority: Routine  Not on file Surgical History   Surgical History    No past medical history on file.  Other Surgical History    No past medical history on file.  Patient Data   Height  68 in  BP  106/65 mmHg    Performing Technologist/Nurse   Performing Technologist/Nurse: Androw, Chelsea L          Implants    No active implants to display in this view. Order-Level  Documents - 06/08/2017:   Scan on 06/09/2017 1:34 PM by Default, Provider, MDScan on 06/09/2017 1:34 PM by Default, Provider, MD    Encounter-Level Documents - 06/08/2017:   Scan on 06/09/2017 9:08 PM by Default, Provider, MDScan on 06/09/2017 9:08 PM by Default, Provider, MD  Scan on 06/09/2017 12:13 PM by Default, Provider, MDScan on 06/09/2017 12:13 PM by Default, Provider, MD  Document on 06/08/2017 5:37 PM by Quintella Reichert, MD: ED PB Billing Extract  Electronic signature on 06/08/2017 5:21 PM - Signed  Electronic signature on 06/08/2017 5:21 PM - Signed  Scan on 06/10/2017 4:32 AM by Default, Provider, MDScan on 06/10/2017 4:32 AM by Default, Provider, MD    Signed   Electronically signed by Lelon Perla, MD on 06/09/17 at 1253 EDT Printable Result Report    Result Report  External Result Report    External Result Report     Reproductive/Obstetrics                             Anesthesia Physical Anesthesia Plan  ASA: II  Anesthesia Plan: General   Post-op Pain Management:    Induction: Intravenous  PONV Risk Score and Plan: 3 and Ondansetron and Dexamethasone  Airway Management Planned: Mask  Additional Equipment:   Intra-op Plan:   Post-operative Plan:   Informed Consent: I have reviewed the patients History and Physical, chart, labs and discussed the procedure including the risks, benefits and alternatives for the proposed anesthesia with the patient or authorized representative who has indicated his/her understanding and acceptance.     Plan Discussed with: CRNA  Anesthesia Plan Comments:         Anesthesia Quick Evaluation

## 2017-06-10 NOTE — Discharge Instructions (Signed)
Electrical Cardioversion °Electrical cardioversion is the delivery of a jolt of electricity to restore a normal rhythm to the heart. A rhythm that is too fast or is not regular keeps the heart from pumping well. In this procedure, sticky patches or metal paddles are placed on the chest to deliver electricity to the heart from a device. °This procedure may be done in an emergency if: °· There is low or no blood pressure as a result of the heart rhythm. °· Normal rhythm must be restored as fast as possible to protect the brain and heart from further damage. °· It may save a life. ° °This procedure may also be done for irregular or fast heart rhythms that are not immediately life-threatening. °Tell a health care provider about: °· Any allergies you have. °· All medicines you are taking, including vitamins, herbs, eye drops, creams, and over-the-counter medicines. °· Any problems you or family members have had with anesthetic medicines. °· Any blood disorders you have. °· Any surgeries you have had. °· Any medical conditions you have. °· Whether you are pregnant or may be pregnant. °What are the risks? °Generally, this is a safe procedure. However, problems may occur, including: °· Allergic reactions to medicines. °· A blood clot that breaks free and travels to other parts of your body. °· The possible return of an abnormal heart rhythm within hours or days after the procedure. °· Your heart stopping (cardiac arrest ). This is rare. ° °What happens before the procedure? °Medicines °· Your health care provider may have you start taking: °? Blood-thinning medicines (anticoagulants) so your blood does not clot as easily. °? Medicines may be given to help stabilize your heart rate and rhythm. °· Ask your health care provider about changing or stopping your regular medicines. This is especially important if you are taking diabetes medicines or blood thinners. °General instructions °· Plan to have someone take you home from  the hospital or clinic. °· If you will be going home right after the procedure, plan to have someone with you for 24 hours. °· Follow instructions from your health care provider about eating or drinking restrictions. °What happens during the procedure? °· To lower your risk of infection: °? Your health care team will wash or sanitize their hands. °? Your skin will be washed with soap. °· An IV tube will be inserted into one of your veins. °· You will be given a medicine to help you relax (sedative). °· Sticky patches (electrodes) or metal paddles may be placed on your chest. °· An electrical shock will be delivered. °The procedure may vary among health care providers and hospitals. °What happens after the procedure? °· Your blood pressure, heart rate, breathing rate, and blood oxygen level will be monitored until the medicines you were given have worn off. °· Do not drive for 24 hours if you were given a sedative. °· Your heart rhythm will be watched to make sure it does not change. °This information is not intended to replace advice given to you by your health care provider. Make sure you discuss any questions you have with your health care provider. °Document Released: 02/21/2002 Document Revised: 10/31/2015 Document Reviewed: 09/07/2015 °Elsevier Interactive Patient Education © 2017 [employer] ° °

## 2017-06-10 NOTE — Progress Notes (Addendum)
Progress Note  Patient Name: Darlene Wang Date of Encounter: 06/10/2017  Primary Cardiologist: [name], MD   Subjective   Worried about procedure   Inpatient Medications    Scheduled Meds: . apixaban  2.5 mg Oral BID  . atorvastatin  40 mg Oral QHS  . calcium-vitamin D  1 tablet Oral Daily  . cholecalciferol  1,000 Units Oral Daily  . digoxin  0.125 mg Oral Once per day on Mon Tue Wed Thu Fri  . estrogens (conjugated)  0.45 mg Oral QHS  . lisinopril  2.5 mg Oral Daily  . metoprolol tartrate  25 mg Oral BID  . multivitamin with minerals  1 tablet Oral Daily   Continuous Infusions: . sodium chloride     PRN Meds: acetaminophen, ondansetron (ZOFRAN) IV   Vital Signs    Vitals:   06/09/17 1400 06/09/17 1500 06/09/17 2137 06/10/17 0635  BP: 101/69 93/78 106/68 107/69  Pulse:  (!) 118 (!) 111 63  Resp: 17 18    Temp:  97.8 F (36.6 C) 97.9 F (36.6 C) 97.8 F (36.6 C)  TempSrc:  Oral Oral Oral  SpO2:  100% 97% 95%  Weight:  148 lb 8 oz (67.4 kg)  147 lb 1.6 oz (66.7 kg)  Height:  5\' 8"  (1.727 m)      Intake/Output Summary (Last 24 hours) at 06/10/2017 0812 Last data filed at 06/10/2017 0753 Gross per 24 hour  Intake 582 ml  Output -  Net 582 ml   Filed Weights   06/08/17 1452 06/09/17 1500 06/10/17 0635  Weight: 149 lb (67.6 kg) 148 lb 8 oz (67.4 kg) 147 lb 1.6 oz (66.7 kg)    Telemetry    Flutter rate 120 06/10/2017  - Personally Reviewed  ECG    Flutter low voltage  - Personally Reviewed  Physical Exam   GEN: No acute distress.   Neck: No JVD Cardiac: RRR, no murmurs, rubs, or gallops.  Respiratory: Clear to auscultation bilaterally. GI: Soft, nontender, non-distended  MS: No edema; No deformity. Neuro:  Nonfocal  Psych: Normal affect   Labs    Chemistry Recent Labs  Lab 06/08/17 1148 06/08/17 1931 06/09/17 0432  NA 137  --  139  K 5.3*  --  4.6  CL 106  --  110  CO2 19*  --  21*  GLUCOSE 100*  --  92  BUN 34*  --  26*    CREATININE 1.49*  --  1.30*  CALCIUM 9.8  --  9.0  PROT  --  6.8  --   ALBUMIN  --  3.4*  --   AST  --  28  --   ALT  --  16  --   ALKPHOS  --  67  --   BILITOT  --  0.4  --   GFRNONAA 32*  --  38*  GFRAA 37*  --  44*  ANIONGAP 12  --  8     Hematology Recent Labs  Lab 06/08/17 1148 06/09/17 0432  WBC 6.6 4.8  RBC 4.23 3.72*  HGB 12.8 11.0*  HCT 39.6 34.0*  MCV 93.6 91.4  MCH 30.3 29.6  MCHC 32.3 32.4  RDW 13.5 13.2  PLT 219 195    Cardiac Enzymes Recent Labs  Lab 06/08/17 1931 06/09/17 0432  TROPONINI <0.03 <0.03    Recent Labs  Lab 06/08/17 1158  TROPIPOC 0.00     BNPNo results for input(s): BNP, PROBNP in the  last 168 hours.   DDimer No results for input(s): DDIMER in the last 168 hours.   Radiology    Dg Chest 2 View  Result Date: 06/08/2017 CLINICAL DATA:  Tachycardia EXAM: CHEST - 2 VIEW COMPARISON:  06/15/2015 FINDINGS: There is patchy density at the left base and right perihilar lung. Chronic patchy opacity at the apices, greater on the right. This apical opacities attributed to scarring based on 2015 chest CT. No Kerley lines, effusion, or pneumothorax. Normal heart size. Mild aortic tortuosity. IMPRESSION: 1. Patchy right perihilar and left base opacity compatible with infection in the appropriate clinical setting. If infectious symptoms, followup PA and lateral chest X-ray is recommended in 3-4 weeks following trial of antibiotic therapy to ensure resolution. If no infectious symptoms, a CT could further evaluate. 2. Biapical pleural scarring. Electronically Signed   By: Monte Fantasia M.D.   On: 06/08/2017 13:34   Ct Chest Wo Contrast  Result Date: 06/09/2017 CLINICAL DATA:  Bilateral lung opacities on chest radiograph performed for tachycardia. Nonsmoker. EXAM: CT CHEST WITHOUT CONTRAST TECHNIQUE: Multidetector CT imaging of the chest was performed following the standard protocol without IV contrast. COMPARISON:  Chest radiograph from one day  prior. 09/02/2013 chest CT. FINDINGS: Cardiovascular: Normal heart size. No significant pericardial fluid/thickening. Atherosclerotic nonaneurysmal thoracic aorta. Normal caliber pulmonary arteries. Mediastinum/Nodes: No discrete thyroid nodules. Unremarkable esophagus. No pathologically enlarged axillary, mediastinal or gross hilar lymph nodes, noting limited sensitivity for the detection of hilar adenopathy on this noncontrast study. Lungs/Pleura: No pneumothorax. No pleural effusion. There are numerous new patchy nodular peribronchovascular foci of consolidation scattered throughout both lungs involving all of the lung lobes, with representative 2.2 x 1.9 cm basilar right lower lobe (series 8/image 128) and anteromedial left upper lobe 2.7 x 2.6 cm (series 8/image 41) nodular foci of peribronchovascular consolidation. Bandlike pleural-parenchymal scarring in the posterior biapical lungs is stable. Of the previously visualized small solid pulmonary nodules scattered in both lungs on the 09/02/2013 chest CT are stable, largest 4 mm in the posterior right lower lobe (series 8/image 99), considered benign. Upper abdomen: Cholelithiasis. Musculoskeletal: No aggressive appearing focal osseous lesions. Mild thoracic spondylosis. IMPRESSION: 1. New patchy nodular peribronchovascular foci of consolidation throughout both lungs, most compatible with a multilobar bronchopneumonia. Recommend follow-up PA and lateral post treatment chest radiographs in 4-6 weeks. 2. Previously visualized scattered small solid pulmonary nodules on the 09/02/2013 chest CT are stable and considered benign. 3. Stable chronic biapical pleural-parenchymal scarring. 4. Cholelithiasis. Aortic Atherosclerosis (ICD10-I70.0). Electronically Signed   By: Ilona Sorrel M.D.   On: 06/09/2017 14:44    Cardiac Studies   Echo pending   Patient Profile     83 y.o. female with a week of tachycardia. Noted to be in atypical flutter in ER Confirmed with  adenosine . CRF secondary to HTN and DM  On dose adjusted Eliquis. Poor rate control with soft BP Tentative TEE/DCC 06/10/17    Assessment & Plan    Flutter:  Continue beta blocker add digoxin  Echo with normal EF mild MR and normal Atrial sizes. Flutter is atypical if she fails [facility] will need consideration for ablation On Eliquis has had at least 3 doses. Can probably be d/c latter today if successful Cardioversion Have opted for 2.5 mg bid dose of eliquis as patient is over 88 yo, Cr Has been 1.5 and she is only 65 kg in weight.     For questions or updates, please contact [place] Please consult [email or web address] for contact  info under Cardiology/STEMI.      Signed, [name], MD  06/10/2017, 8:12 AM

## 2017-06-10 NOTE — Transfer of Care (Signed)
Immediate Anesthesia Transfer of Care Note  Patient: Darlene Wang  Procedure(s) Performed: TRANSESOPHAGEAL ECHOCARDIOGRAM (TEE) (N/A ) CARDIOVERSION (N/A )  Patient Location: PACU  Anesthesia Type:MAC  Level of Consciousness: awake  Airway & Oxygen Therapy: Patient Spontanous Breathing and Patient connected to nasal cannula oxygen  Post-op Assessment: Report given to RN and Post -op Vital signs reviewed and stable  Post vital signs: Reviewed and stable  Last Vitals:  Vitals Value Taken Time  BP    Temp    Pulse 82 06/10/2017  9:43 AM  Resp 24 06/10/2017  9:43 AM  SpO2 100 % 06/10/2017  9:43 AM  Vitals shown include unvalidated device data.  Last Pain:  Vitals:   06/10/17 0822  TempSrc: Oral  PainSc: 0-No pain         Complications: No apparent anesthesia complications

## 2017-06-10 NOTE — Anesthesia Postprocedure Evaluation (Signed)
Anesthesia Post Note  Patient: Darlene Wang  Procedure(s) Performed: TRANSESOPHAGEAL ECHOCARDIOGRAM (TEE) (N/A ) CARDIOVERSION (N/A )     Patient location during evaluation: Endoscopy Anesthesia Type: General Level of consciousness: awake and sedated Pain management: pain level controlled Vital Signs Assessment: post-procedure vital signs reviewed and stable Respiratory status: spontaneous breathing Cardiovascular status: stable Postop Assessment: no apparent nausea or vomiting Anesthetic complications: no    Last Vitals:  Vitals:   06/10/17 0950 06/10/17 1000  BP: (!) 97/53 (!) 98/58  Pulse: 77 73  Resp: 15 15  Temp:    SpO2: 97% 98%    Last Pain:  Vitals:   06/10/17 1000  TempSrc:   PainSc: 0-No pain   Pain Goal:                 Sandie Swayze JR,JOHN Niemah Schwebke

## 2017-06-11 NOTE — Consult Note (Signed)
            [facility] CM Primary Care Navigator  06/11/2017  Darlene Wang 09-15-1934 [id number]   Attempt to seepatient at the bedside to identify possible discharge needsbutshe was already dischargedhomeperstaffreport.  Per chart review, patient was admitted for further evaluation of atrial tachycardia. She was recently seen by her primary care provider (PCP) due to feelings of fast heart rate and palpitations. Patient underwent TEE (transesophageal echocardiogram) with successful cardioversion from atrial flutter to normal sinus rhythm.  Primary care provider's officeis listed as providingtransition of care (TOC)follow-up.   Patient has discharge instruction to follow-up with cardiology on 06/26/17.   For additional questions please contact:  Edwena Felty A. Lashunda Greis, BSN, RN-BC [facility] PRIMARY CARE Navigator Cell: [phone]

## 2017-06-26 ENCOUNTER — Ambulatory Visit (INDEPENDENT_AMBULATORY_CARE_PROVIDER_SITE_OTHER): Payer: Medicare Other | Admitting: Physician Assistant

## 2017-06-26 ENCOUNTER — Encounter: Payer: Self-pay | Admitting: Cardiology

## 2017-06-26 VITALS — BP 124/58 | HR 69 | Ht 67.0 in | Wt 151.0 lb

## 2017-06-26 DIAGNOSIS — R7303 Prediabetes: Secondary | ICD-10-CM

## 2017-06-26 DIAGNOSIS — I484 Atypical atrial flutter: Secondary | ICD-10-CM | POA: Diagnosis not present

## 2017-06-26 DIAGNOSIS — Z8679 Personal history of other diseases of the circulatory system: Secondary | ICD-10-CM | POA: Diagnosis not present

## 2017-06-26 DIAGNOSIS — I10 Essential (primary) hypertension: Secondary | ICD-10-CM | POA: Diagnosis not present

## 2017-06-26 DIAGNOSIS — R9431 Abnormal electrocardiogram [ECG] [EKG]: Secondary | ICD-10-CM

## 2017-06-26 DIAGNOSIS — R9389 Abnormal findings on diagnostic imaging of other specified body structures: Secondary | ICD-10-CM

## 2017-06-26 DIAGNOSIS — N184 Chronic kidney disease, stage 4 (severe): Secondary | ICD-10-CM

## 2017-06-26 DIAGNOSIS — Z9889 Other specified postprocedural states: Secondary | ICD-10-CM

## 2017-06-26 NOTE — Patient Instructions (Addendum)
Medication Instructions:   STOP TAKING DIGOXIN    If you need a refill on your cardiac medications before your next appointment, please call your pharmacy.  Labwork: NONE ORDERED  TODAY    Testing/Procedures: NONE ORDERED  TODAY    Follow-Up:  WITH 3 MONTHS NISHAN  WITH AN EKG    Any Other Special Instructions Will Be Listed Below (If Applicable).

## 2017-06-26 NOTE — Progress Notes (Signed)
Cardiology Office Note:    Date:  06/26/2017   ID:  Darlene Wang, DOB 09/16/1934, MRN [id number]  PCP:  Darlene Ruff, MD  Cardiologist:  Darlene Rouge, MD   Referring MD: Darlene Ruff, MD   Chief Complaint  Patient presents with  . Hospitalization Follow-up    atrial flutter    History of Present Illness:    Darlene Wang is a 83 y.o. female with a hx of new onset atypical atrial flutter, atrial tachycardia, HTN, CKD stage 4, pre-diabetes.  She was recently seen in the hospital for atrial tachycardia. She initially presented to her PCP office with fast heart rate and palpitations on 06/08/17.Darlene Wang EKG with new TWI V2/3 and sinus tachycardia vs atrial tachycardia with block. She was sent to [facility] for further evaluation. She remained asymptomatic. 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 She has noticed 2 week history of rapid heart rated noted on her BP machine. She also feels her heart beat fast and hard when lying down to sleep at night. She is very active at baseline and exercises regularly. She was admitted to cardiology. On arrival, HR was in the 120s and she was given 6 mg adenosine which slowed her rhythm to identify atypical atrial flutter. Her rate soon returned to the 120s. She was started on anticoagulation and initial plan was for rate control, given her lack of symptoms. EP was consulted as her rate was difficult to control. They increased beta blocker, decreased ACEI, and started digoxin 0.125 mg M-F only. On 06/10/17, she underwent TEE/DCCV with successful cardioversion to NSR. She was discharge on 06/10/17. Of noted, CT chest on 06/09/17 read as multilobar bronchopneumonia with recommendations for a follow up CXR in 4-6 weeks. She did not receive ABX during her admission and was afebrile without leukocytosis. She reported playing the clarinet without difficulty, and has known apical scarring.  She returns today for her hospital follow up. She has done well  since discharge. She has no bleeding problems on eliquis. ASA was D/C'ed. She feels well, but has not completely resumed her full exercise regimen yet. She plays the clarinet daily. She denies chest pain, shortness of breath, DOE, orthopnea, lower extremity swelling, jaw pain, recent illness, fevers, chills, falls, and syncopal episodes.   CXR on admission with possible PNA. She was afebrile, without leukocytosis, and clinically well. CT chest with new patchy nodular peribronchovascular foci of consolidation throughout both lungs, most compatible with a multilobar bronchopneumonia. Per radiology, recommend follow-up PA and lateral post treatment chest radiographs in 4-6 weeks. She did not clinical show signs of PNA in the hospital and was not treated for PNA.  In duscussion with the patient, she states that she has had abnormal CXRs for years. She continues to play the clarinet for at least 2 hours daily. She is well-appearing, denies recent fever, and denies SOB.   Past Medical History:  Diagnosis Date  . CKD (chronic kidney disease)    stage 3   . Diabetes mellitus without complication (HCC)    pre-diabetes   . Hypertension   . Pre-diabetes     Past Surgical History:  Procedure Laterality Date  . CARDIOVERSION N/A 06/10/2017   Procedure: CARDIOVERSION;  Surgeon: Darlene Margarita, MD;  Location: [facility] ENDOSCOPY;  Service: Cardiovascular;  Laterality: N/A;  . TEE WITHOUT CARDIOVERSION N/A 06/10/2017   Procedure: TRANSESOPHAGEAL ECHOCARDIOGRAM (TEE);  Surgeon: Darlene Margarita, MD;  Location: [place];  Service: Cardiovascular;  Laterality: N/A;  Current Medications: No outpatient medications have been marked as taking for the 06/26/17 encounter (Office Visit) with Darlene Wang, [place].     Allergies:   Amoxicillin; Other; Pneumococcal vaccine; and Tuberculin ppd   Social History   Socioeconomic History  . Marital status: Married    Spouse name: Not on file  . Number of children: Not  on file  . Years of education: Not on file  . Highest education level: Not on file  Occupational History  . Not on file  Social Needs  . Financial resource strain: Not on file  . Food insecurity:    Worry: Not on file    Inability: Not on file  . Transportation needs:    Medical: Not on file    Non-medical: Not on file  Tobacco Use  . Smoking status: Never Smoker  . Smokeless tobacco: Never Used  Substance and Sexual Activity  . Alcohol use: Not on file  . Drug use: Never  . Sexual activity: Not on file  Lifestyle  . Physical activity:    Days per week: Not on file    Minutes per session: Not on file  . Stress: Not on file  Relationships  . Social connections:    Talks on phone: Not on file    Gets together: Not on file    Attends religious service: Not on file    Active member of club or organization: Not on file    Attends meetings of clubs or organizations: Not on file    Relationship status: Not on file  Other Topics Concern  . Not on file  Social History Narrative  . Not on file     Family History: The patient's family history includes Diabetes in her father; Heart failure in her mother; Stroke in her father and sister.  ROS:   Please see the history of present illness.     All other systems reviewed and are negative.  EKGs/Labs/Other Studies Reviewed:    The following studies were reviewed today:  Echocardiogram 06/09/17: Study Conclusions - Left ventricle: The cavity size was normal. Wall thickness was increased in a pattern of mild LVH. Systolic function was normal. The estimated ejection fraction was in the range of 60% to 65%.Wall motion was normal; there were no regional wall motionabnormalities. The study is not technically sufficient to allow evaluation of LV diastolic function. - Aortic valve: There was trivial regurgitation. - Mitral valve: There was mild regurgitation.  Impressions: - Normal LV function; mild LVH; trace AI; mild MR and  TR.   TEE with DCCV: 06/10/17 Successful Aflutter>>NSR Results: Normal LV size and functionwith EF60-65% Normal RV size and function Normal RA Mildly dilatedLAwith spontaneous echo contrast. There is no thrombus in LAA or LA. The LAA emptying velocity is mildly reduced at 35. Normal TVwith mild to moderate TR Normal PVwith mild PR Normal MVwith mild MR Normal trileaflet AVwith mild AR Normal interatrial septum with no evidence of shunt by colorflow dopper  Normal thoracic and ascending aorta.   EKG:  EKG is ordered today.  The ekg ordered today demonstrates sinus rhythm, ST depression in inferior leads and V5/V6  Recent Labs: 06/08/2017: ALT 16; Magnesium 2.0; TSH 3.144 06/09/2017: BUN 26; Creatinine, Ser 1.30; Hemoglobin 11.0; Platelets 195; Potassium 4.6; Sodium 139  Recent Lipid Panel    Component Value Date/Time   CHOL 111 06/09/2017 0432   TRIG 88 06/09/2017 0432   HDL 60 06/09/2017 0432   CHOLHDL 1.9 06/09/2017 0432  VLDL 18 06/09/2017 0432   LDLCALC 33 06/09/2017 0432    Physical Exam:    VS:  BP (!) 124/58   Pulse 69   Ht 5\' 7"  (1.702 m)   Wt 151 lb (68.5 kg)   BMI 23.65 kg/m     Wt Readings from Last 3 Encounters:  06/26/17 151 lb (68.5 kg)  06/10/17 147 lb 1.6 oz (66.7 kg)     GEN: Well nourished, well developed in no acute distress HEENT: Normal NECK: No JVD; No carotid bruits LYMPHATICS: No lymphadenopathy CARDIAC: RRR, no murmurs, rubs, gallops RESPIRATORY:  Clear to auscultation without rales, wheezing or rhonchi  ABDOMEN: Soft, non-tender, non-distended MUSCULOSKELETAL:  No edema; No deformity  SKIN: Warm and dry NEUROLOGIC:  Alert and oriented x 3 PSYCHIATRIC:  Normal affect   ASSESSMENT:    1. History of atrial flutter   2. Atypical atrial flutter (HCC)   3. History of cardioversion   4. Abnormal EKG   5. CKD (chronic kidney disease) stage 4, GFR 15-29 ml/min (HCC)   6. Essential hypertension   7. Pre-diabetes   8.  Abnormal CT of the chest    PLAN:    In order of problems listed above:  History of atrial flutter  Atypical atrial flutter ([place]) History of cardioversion This was a first occurrence of atypical atrial flutter, s/p TEE/DCCV with conversion to NSR. Continue low dose eliquis. She is 83 yo. sCr was 1.49 on admission to the hospital and 1.3 on discharge;  she has a history of CKD stage 4. She weighs 67.6 kg. She needs to continue at least 4 weeks of anticoagulation s/p DCCV, likely life-long. In consultation with Dr. Curt Bears, will D/C digoxin today and continue low dose eliquis as her creatinine likely fluctuates with her CKD. Will continue lopressor 25 mg BID for now. This patients CHA2DS2-VASc Score and unadjusted Ischemic Stroke Rate (% per year) is equal to 4.8 % stroke rate/year from a score of 59 (age, female, HTN).   Abnormal EKG EKG today with sinus rhythm, but new ST depression in inferior leads and V5/V6 that were not present in her post-cardioversion EKG. She is completely asymptomatic. In consultation with Dr. Curt Bears, will defer ischemic evaluation at this time. I will send a message to Dr. Johnsie Cancel as well. Her new onset atrial flutter without precipitating factors (no infection, no alcohol) may be a reason to consider ischemic workup.   CKD (chronic kidney disease) stage 4, GFR 15-29 ml/min (HCC) Creatinine stable. She follows with nephrology.  Essential hypertension Pressures well-controlled on current regimen. No medication changes.  Pre-diabetes A1c was 5.7.   Abnormal CT of the chest CXR on admission with possible PNA. She was afebrile, without leukocytosis, and clinically well. CT chest with new patchy nodular peribronchovascular foci of consolidation throughout both lungs, most compatible with a multilobar bronchopneumonia. Recommend follow-up PA and lateral post treatment chest radiographs in 4-6 weeks. In discussion with the patient, she states that she has had abnormal CXRs  for years. She continues to play the clarinet for at least 2 hours daily. She is well-appearing, denies recent fever, and denies SOB. I will defer decisions about follow up CXR to her PCP. I have low suspicion that this abnormal CXR was a precipitating factor in her atrial flutter.   Medication Adjustments/Labs and Tests Ordered: Current medicines are reviewed at length with the patient today.  Concerns regarding medicines are outlined above.  Orders Placed This Encounter  Procedures  . EKG 12-Lead  No orders of the defined types were placed in this encounter.   Signed, Darlene Wang, [place]  06/26/2017 3:48 PM    [facility]

## 2017-06-30 ENCOUNTER — Other Ambulatory Visit: Payer: Self-pay | Admitting: Physician Assistant

## 2017-06-30 DIAGNOSIS — I484 Atypical atrial flutter: Secondary | ICD-10-CM

## 2017-07-01 ENCOUNTER — Encounter (HOSPITAL_COMMUNITY): Payer: Self-pay | Admitting: *Deleted

## 2017-07-06 ENCOUNTER — Telehealth (HOSPITAL_COMMUNITY): Payer: Self-pay | Admitting: *Deleted

## 2017-07-06 ENCOUNTER — Encounter (HOSPITAL_COMMUNITY): Payer: Medicare Other

## 2017-07-06 NOTE — Telephone Encounter (Signed)
Patient given detailed instructions per Myocardial Perfusion Study Information Sheet for the test on 07/08/17 at 1000. Patient notified to arrive 15 minutes early and that it is imperative to arrive on time for appointment to keep from having the test rescheduled.  If you need to cancel or reschedule your appointment, please call the office within 24 hours of your appointment. . Patient verbalized understanding.Trygg Mantz, [place]

## 2017-07-08 ENCOUNTER — Ambulatory Visit (HOSPITAL_COMMUNITY): Payer: Medicare Other | Attending: Cardiology

## 2017-07-08 DIAGNOSIS — I484 Atypical atrial flutter: Secondary | ICD-10-CM | POA: Insufficient documentation

## 2017-07-08 DIAGNOSIS — R000 Tachycardia, unspecified: Secondary | ICD-10-CM | POA: Insufficient documentation

## 2017-07-08 DIAGNOSIS — I10 Essential (primary) hypertension: Secondary | ICD-10-CM | POA: Diagnosis not present

## 2017-07-08 DIAGNOSIS — R9431 Abnormal electrocardiogram [ECG] [EKG]: Secondary | ICD-10-CM | POA: Diagnosis not present

## 2017-07-08 LAB — MYOCARDIAL PERFUSION IMAGING
CHL CUP NUCLEAR SRS: 7
CHL CUP NUCLEAR SSS: 12
CSEPPHR: 82 {beats}/min
LV sys vol: 17 mL
LVDIAVOL: 75 mL (ref 46–106)
RATE: 0.34
Rest HR: 61 {beats}/min
SDS: 5
TID: 0.85

## 2017-07-08 MED ORDER — TECHNETIUM TC 99M TETROFOSMIN IV KIT
10.1000 | PACK | Freq: Once | INTRAVENOUS | Status: AC | PRN
  Administered 2017-07-08: 10.1 via INTRAVENOUS
  Filled 2017-07-08: qty 11

## 2017-07-08 MED ORDER — TECHNETIUM TC 99M TETROFOSMIN IV KIT
30.9000 | PACK | Freq: Once | INTRAVENOUS | Status: AC | PRN
  Administered 2017-07-08: 30.9 via INTRAVENOUS
  Filled 2017-07-08: qty 31

## 2017-07-08 MED ORDER — REGADENOSON 0.4 MG/5ML IV SOLN
0.4000 mg | Freq: Once | INTRAVENOUS | Status: AC
  Administered 2017-07-08: 0.4 mg via INTRAVENOUS

## 2017-09-02 DIAGNOSIS — J949 Pleural condition, unspecified: Secondary | ICD-10-CM | POA: Diagnosis not present

## 2017-09-23 ENCOUNTER — Other Ambulatory Visit: Payer: Self-pay | Admitting: Pharmacist

## 2017-09-23 ENCOUNTER — Other Ambulatory Visit: Payer: Self-pay | Admitting: Cardiology

## 2017-09-23 DIAGNOSIS — I4891 Unspecified atrial fibrillation: Secondary | ICD-10-CM

## 2017-09-23 NOTE — Telephone Encounter (Signed)
SCr < 1.5 and weight > 60kg. SCr was 1.49 on hospital discharge. Called pt and she will come in for BMET on 7/15. Will likely need Eliquis dose increase to 5mg  BID.

## 2017-09-28 ENCOUNTER — Other Ambulatory Visit: Payer: Self-pay | Admitting: Pharmacist

## 2017-09-28 ENCOUNTER — Other Ambulatory Visit: Payer: Medicare Other | Admitting: *Deleted

## 2017-09-28 DIAGNOSIS — I4891 Unspecified atrial fibrillation: Secondary | ICD-10-CM | POA: Diagnosis not present

## 2017-09-28 LAB — BASIC METABOLIC PANEL
BUN/Creatinine Ratio: 17 (ref 12–28)
BUN: 23 mg/dL (ref 8–27)
CO2: 22 mmol/L (ref 20–29)
CREATININE: 1.33 mg/dL — AB (ref 0.57–1.00)
Calcium: 9.3 mg/dL (ref 8.7–10.3)
Chloride: 100 mmol/L (ref 96–106)
GFR calc Af Amer: 44 mL/min/{1.73_m2} — ABNORMAL LOW (ref >59)
GFR calc non Af Amer: 38 mL/min/{1.73_m2} — ABNORMAL LOW (ref >59)
Glucose: 94 mg/dL (ref 65–99)
Potassium: 4.8 mmol/L (ref 3.5–5.2)
SODIUM: 138 mmol/L (ref 134–144)

## 2017-09-28 MED ORDER — APIXABAN 5 MG PO TABS: 5.0000 mg | ORAL_TABLET | Freq: Two times a day (BID) | ORAL | 1 refills | Status: DC

## 2017-09-29 NOTE — Progress Notes (Signed)
Cardiology Office Note:    Date:  10/05/2017   ID:  Darlene Wang, DOB 1935-02-08, MRN [id number]  PCP:  [name], MD  Cardiologist:  [name], MD   Referring MD: [name], MD   No chief complaint on file.   History of Present Illness:    83 y.o. seen in f/u for atypical flutter. Last seen by PA April 2019.  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 EF normal 60-65% mild MR/AR   Of noted, CT chest on 06/09/17 read as multilobar bronchopneumonia with recommendations for a follow up CXR in 4-6 weeks. She did not receive ABX during her admission and was afebrile without leukocytosis. She reported playing the clarinet without difficulty, and has known apical scarring. She indicates abnormal CXR;s for years   She has no bleeding problems on eliquis. ASA was D/C'ed.    No cardiac complaints BP on my exam after sitting for a while 130/80  Past Medical History:  Diagnosis Date  . CKD (chronic kidney disease)    stage 3   . Diabetes mellitus without complication (HCC)    pre-diabetes   . Hypertension   . Pre-diabetes     Past Surgical History:  Procedure Laterality Date  . CARDIOVERSION N/A 06/10/2017   Procedure: CARDIOVERSION;  Surgeon: Sueanne Margarita, MD;  Location: [facility] ENDOSCOPY;  Service: Cardiovascular;  Laterality: N/A;  . TEE WITHOUT CARDIOVERSION N/A 06/10/2017   Procedure: TRANSESOPHAGEAL ECHOCARDIOGRAM (TEE);  Surgeon: Sueanne Margarita, MD;  Location: [facility] ENDOSCOPY;  Service: Cardiovascular;  Laterality: N/A;    Current Medications: Current Meds  Medication Sig  . apixaban (ELIQUIS) 5 MG TABS tablet Take 1 tablet (5 mg total) by mouth 2 (two) times daily.  Marland Kitchen atorvastatin (LIPITOR) 40 MG tablet Take 40 mg by mouth at bedtime.  . Calcium Carbonate-Vitamin D (CALCIUM 600+D) 600-200 MG-UNIT TABS Take 600 mg by mouth  daily.  . cholecalciferol (VITAMIN D) 1000 units tablet Take 1,000 Units by mouth daily.  Marland Kitchen estrogens, conjugated, (PREMARIN) 0.45 MG tablet Take 0.45 mg by mouth at bedtime.  Marland Kitchen lisinopril (PRINIVIL,ZESTRIL) 2.5 MG tablet TAKE 1 TABLET(2.5 MG) BY MOUTH DAILY  . metoprolol tartrate (LOPRESSOR) 25 MG tablet TAKE 1 TABLET(25 MG) BY MOUTH TWICE DAILY  . Multiple Vitamin (MULTIVITAMIN) capsule Take 1 capsule by mouth daily.     Allergies:   Amoxicillin; Other; Pneumococcal vaccine; and Tuberculin ppd   Social History   Socioeconomic History  . Marital status: Married    Spouse name: Not on file  . Number of children: Not on file  . Years of education: Not on file  . Highest education level: Not on file  Occupational History  . Not on file  Social Needs  . Financial resource strain: Not on file  . Food insecurity:    Worry: Not on file    Inability: Not on file  . Transportation needs:    Medical: Not on file    Non-medical: Not on file  Tobacco Use  . Smoking status: Never Smoker  . Smokeless tobacco: Never Used  Substance and Sexual Activity  . Alcohol use: Not on file  . Drug use: Never  . Sexual activity: Not on file  Lifestyle  . Physical activity:    Days per week: Not on file    Minutes per session: Not on file  .  Stress: Not on file  Relationships  . Social connections:    Talks on phone: Not on file    Gets together: Not on file    Attends religious service: Not on file    Active member of club or organization: Not on file    Attends meetings of clubs or organizations: Not on file    Relationship status: Not on file  Other Topics Concern  . Not on file  Social History Narrative  . Not on file     Family History: The patient's family history includes Diabetes in her father; Heart failure in her mother; Stroke in her father and sister.  ROS:   Please see the history of present illness.     All other systems reviewed and are negative.  EKGs/Labs/Other  Studies Reviewed:    The following studies were reviewed today:  Echocardiogram 06/09/17: Study Conclusions - Left ventricle: The cavity size was normal. Wall thickness was increased in a pattern of mild LVH. Systolic function was normal. The estimated ejection fraction was in the range of 60% to 65%.Wall motion was normal; there were no regional wall motionabnormalities. The study is not technically sufficient to allow evaluation of LV diastolic function. - Aortic valve: There was trivial regurgitation. - Mitral valve: There was mild regurgitation.  Impressions: - Normal LV function; mild LVH; trace AI; mild MR and TR.   TEE with DCCV: 06/10/17 Successful Aflutter>>NSR Results: Normal LV size and functionwith EF60-65% Normal RV size and function Normal RA Mildly dilatedLAwith spontaneous echo contrast. There is no thrombus in LAA or LA. The LAA emptying velocity is mildly reduced at 35. Normal TVwith mild to moderate TR Normal PVwith mild PR Normal MVwith mild MR Normal trileaflet AVwith mild AR Normal interatrial septum with no evidence of shunt by colorflow dopper  Normal thoracic and ascending aorta.   EKG:  06/26/17   sinus rhythm, non specific ST/T Wave changes   Recent Labs: 06/08/2017: ALT 16; Magnesium 2.0; TSH 3.144 06/09/2017: Hemoglobin 11.0; Platelets 195 09/28/2017: BUN 23; Creatinine, Ser 1.33; Potassium 4.8; Sodium 138  Recent Lipid Panel    Component Value Date/Time   CHOL 111 06/09/2017 0432   TRIG 88 06/09/2017 0432   HDL 60 06/09/2017 0432   CHOLHDL 1.9 06/09/2017 0432   VLDL 18 06/09/2017 0432   LDLCALC 33 06/09/2017 0432    Physical Exam:    VS:  BP (!) 158/60 (BP Location: Left Arm, Patient Position: Sitting, Cuff Size: Normal)   Pulse (!) 58   Ht 5\' 7"  (1.702 m)   Wt 150 lb (68 kg)   SpO2 98%   BMI 23.49 kg/m     Wt Readings from Last 3 Encounters:  10/05/17 150 lb (68 kg)  07/08/17 151 lb (68.5 kg)  06/26/17 151 lb (68.5  kg)     Affect appropriate Healthy:  appears stated age 19: normal Neck supple with no adenopathy JVP normal no bruits no thyromegaly Lungs clear with no wheezing and good diaphragmatic motion Heart:  S1/S2 no murmur, no rub, gallop or click PMI normal Abdomen: benighn, BS positve, no tenderness, no AAA no bruit.  No HSM or HJR Distal pulses intact with no bruits No edema Neuro non-focal Skin warm and dry No muscular weakness   ASSESSMENT:    1. Atypical atrial flutter ([place])   2. Essential hypertension   3. CKD (chronic kidney disease) stage 4, GFR 15-29 ml/min (HCC)    PLAN:    In order of problems  listed above:  Flutter:  Post TEE/DCC 06/10/17  Initially On low dose eliquis given age and CKD Digoxin d/c last visit Continue beta blocker most recent CR on 09/28/17 1.33 with GFR 38 transitioned to 5 bid dose   CKD (chronic kidney disease) stage 3  CR 1.3 09/28/17 f/u with nephrology  Essential hypertension   Pressures well-controlled on current regimen. No medication changes.  Pre-diabetes A1c was 5.7.   Pulmonary:  CT chest 06/09/17 with chronic small nodules apical scarring ? Pneumonia not clinically evident with history of chronically abnormal CXR Non smoker normal exam chronic abnormality f/u primary no need for more testing at this time    [name]

## 2017-10-05 ENCOUNTER — Ambulatory Visit (INDEPENDENT_AMBULATORY_CARE_PROVIDER_SITE_OTHER): Payer: Medicare Other | Admitting: Cardiovascular Disease

## 2017-10-05 VITALS — BP 158/60 | HR 58 | Ht 67.0 in | Wt 150.0 lb

## 2017-10-05 DIAGNOSIS — I10 Essential (primary) hypertension: Secondary | ICD-10-CM | POA: Diagnosis not present

## 2017-10-05 DIAGNOSIS — I484 Atypical atrial flutter: Secondary | ICD-10-CM | POA: Diagnosis not present

## 2017-10-05 DIAGNOSIS — N184 Chronic kidney disease, stage 4 (severe): Secondary | ICD-10-CM

## 2017-10-05 NOTE — Patient Instructions (Signed)

## 2017-10-08 DIAGNOSIS — Z7901 Long term (current) use of anticoagulants: Secondary | ICD-10-CM | POA: Diagnosis not present

## 2017-10-08 DIAGNOSIS — N189 Chronic kidney disease, unspecified: Secondary | ICD-10-CM | POA: Diagnosis not present

## 2017-10-08 DIAGNOSIS — M8580 Other specified disorders of bone density and structure, unspecified site: Secondary | ICD-10-CM | POA: Diagnosis not present

## 2017-10-08 DIAGNOSIS — Z9889 Other specified postprocedural states: Secondary | ICD-10-CM | POA: Diagnosis not present

## 2017-10-08 DIAGNOSIS — R9389 Abnormal findings on diagnostic imaging of other specified body structures: Secondary | ICD-10-CM | POA: Diagnosis not present

## 2017-10-08 DIAGNOSIS — I10 Essential (primary) hypertension: Secondary | ICD-10-CM | POA: Diagnosis not present

## 2017-10-08 DIAGNOSIS — Z8679 Personal history of other diseases of the circulatory system: Secondary | ICD-10-CM | POA: Diagnosis not present

## 2017-10-08 DIAGNOSIS — E7800 Pure hypercholesterolemia, unspecified: Secondary | ICD-10-CM | POA: Diagnosis not present

## 2017-10-08 DIAGNOSIS — E119 Type 2 diabetes mellitus without complications: Secondary | ICD-10-CM | POA: Diagnosis not present

## 2017-10-23 DIAGNOSIS — E782 Mixed hyperlipidemia: Secondary | ICD-10-CM | POA: Diagnosis not present

## 2017-10-23 DIAGNOSIS — N183 Chronic kidney disease, stage 3 (moderate): Secondary | ICD-10-CM | POA: Diagnosis not present

## 2017-10-23 DIAGNOSIS — I129 Hypertensive chronic kidney disease with stage 1 through stage 4 chronic kidney disease, or unspecified chronic kidney disease: Secondary | ICD-10-CM | POA: Diagnosis not present

## 2017-10-23 DIAGNOSIS — N2581 Secondary hyperparathyroidism of renal origin: Secondary | ICD-10-CM | POA: Diagnosis not present

## 2017-10-23 DIAGNOSIS — I4892 Unspecified atrial flutter: Secondary | ICD-10-CM | POA: Diagnosis not present

## 2017-10-23 DIAGNOSIS — D631 Anemia in chronic kidney disease: Secondary | ICD-10-CM | POA: Diagnosis not present

## 2017-10-23 DIAGNOSIS — E1122 Type 2 diabetes mellitus with diabetic chronic kidney disease: Secondary | ICD-10-CM | POA: Diagnosis not present

## 2017-10-23 DIAGNOSIS — Z0000 Encounter for general adult medical examination without abnormal findings: Secondary | ICD-10-CM | POA: Diagnosis not present

## 2017-10-30 ENCOUNTER — Encounter: Payer: Self-pay | Admitting: Pulmonary Disease

## 2017-10-30 ENCOUNTER — Ambulatory Visit (INDEPENDENT_AMBULATORY_CARE_PROVIDER_SITE_OTHER): Payer: Medicare Other | Admitting: Pulmonary Disease

## 2017-10-30 VITALS — BP 144/82 | HR 63 | Ht 66.5 in | Wt 150.6 lb

## 2017-10-30 DIAGNOSIS — R918 Other nonspecific abnormal finding of lung field: Secondary | ICD-10-CM

## 2017-10-30 DIAGNOSIS — R911 Solitary pulmonary nodule: Secondary | ICD-10-CM | POA: Diagnosis not present

## 2017-10-30 NOTE — Progress Notes (Signed)
Darlene Wang    [id number]    [birth year]  Primary Care Physician:Barnes, Benjamine Mola, MD  Referring Physician: [name], [place], [place] [address]  Chief complaint:   History of abnormal CT scan of the chest showing multiple lung nodules  HPI: Patient was hospitalized in March, she was having issues with atrial flutter, required cardioversion She stated that the CT was performed at that time for evaluation of her heart This study at the time did show multiple infiltrative processes, nodules-records did note that she did not have any signs or symptoms of infection at the time She stated she is always felt well Has no respiratory history no history of asthma Possible history of allergies for which she received allergy shots in the past Family history of allergies No family history of cancer Never smoker  Occupation: Nursing Exposures: No mold exposure Smoking history: Never smoker  Outpatient Encounter Medications as of 10/30/2017  Medication Sig  . apixaban (ELIQUIS) 5 MG TABS tablet Take 1 tablet (5 mg total) by mouth 2 (two) times daily.  Marland Kitchen atorvastatin (LIPITOR) 40 MG tablet Take 40 mg by mouth at bedtime.  . Calcium Carbonate-Vitamin D (CALCIUM 600+D) 600-200 MG-UNIT TABS Take 600 mg by mouth daily.  . cholecalciferol (VITAMIN D) 1000 units tablet Take 1,000 Units by mouth daily.  Marland Kitchen estrogens, conjugated, (PREMARIN) 0.45 MG tablet Take 0.45 mg by mouth at bedtime.  Marland Kitchen lisinopril (PRINIVIL,ZESTRIL) 2.5 MG tablet TAKE 1 TABLET(2.5 MG) BY MOUTH DAILY  . metoprolol tartrate (LOPRESSOR) 25 MG tablet TAKE 1 TABLET(25 MG) BY MOUTH TWICE DAILY  . Multiple Vitamin (MULTIVITAMIN) capsule Take 1 capsule by mouth daily.   No facility-administered encounter medications on file as of 10/30/2017.     Allergies as of 10/30/2017 - Review Complete 10/30/2017  Allergen Reaction Noted  . Amoxicillin Swelling 01/01/2011  . Other  09/27/2013  .  Pneumococcal vaccine  09/27/2013  . Tuberculin ppd  10/20/2013    Past Medical History:  Diagnosis Date  . CKD (chronic kidney disease)    stage 3   . Diabetes mellitus without complication (HCC)    pre-diabetes   . Hypertension   . Pre-diabetes     Past Surgical History:  Procedure Laterality Date  . CARDIOVERSION N/A 06/10/2017   Procedure: CARDIOVERSION;  Surgeon: Sueanne Margarita, MD;  Location: [facility] ENDOSCOPY;  Service: Cardiovascular;  Laterality: N/A;  . TEE WITHOUT CARDIOVERSION N/A 06/10/2017   Procedure: TRANSESOPHAGEAL ECHOCARDIOGRAM (TEE);  Surgeon: Sueanne Margarita, MD;  Location: [facility] ENDOSCOPY;  Service: Cardiovascular;  Laterality: N/A;    Family History  Problem Relation Age of Onset  . Heart failure Mother   . Stroke Father   . Diabetes Father   . Stroke Sister     Social History   Socioeconomic History  . Marital status: Married    Spouse name: Not on file  . Number of children: Not on file  . Years of education: Not on file  . Highest education level: Not on file  Occupational History  . Not on file  Social Needs  . Financial resource strain: Not on file  . Food insecurity:    Worry: Not on file    Inability: Not on file  . Transportation needs:    Medical: Not on file    Non-medical: Not on file  Tobacco Use  . Smoking status: Never Smoker  . Smokeless tobacco: Never Used  Substance and Sexual  Activity  . Alcohol use: Not on file  . Drug use: Never  . Sexual activity: Not on file  Lifestyle  . Physical activity:    Days per week: Not on file    Minutes per session: Not on file  . Stress: Not on file  Relationships  . Social connections:    Talks on phone: Not on file    Gets together: Not on file    Attends religious service: Not on file    Active member of club or organization: Not on file    Attends meetings of clubs or organizations: Not on file    Relationship status: Not on file  . Intimate partner violence:    Fear of current or  ex partner: Not on file    Emotionally abused: Not on file    Physically abused: Not on file    Forced sexual activity: Not on file  Other Topics Concern  . Not on file  Social History Narrative  . Not on file    Review of systems: Review of Systems  Constitutional: Negative for fever and chills.  HENT: Negative.   Eyes: Negative for blurred vision.  Respiratory: as per HPI  Cardiovascular: Negative for chest pain and palpitations.  Gastrointestinal: Negative for vomiting, diarrhea, blood per rectum. Genitourinary: Negative for dysuria, urgency, frequency and hematuria.  Musculoskeletal: Negative for myalgias, back pain and joint pain.  Skin: Negative for itching and rash.  Neurological: Negative for dizziness, tremors, focal weakness, seizures and loss of consciousness.  Endo/Heme/Allergies: Negative for environmental allergies.  Psychiatric/Behavioral: Negative for depression, suicidal ideas and hallucinations.  All other systems reviewed and are negative.  Physical Exam:  Vitals:   10/30/17 1040  BP: (!) 144/82  Pulse: 63  SpO2: 100%   Gen:      No acute distress HEENT:  EOMI, sclera anicteric Neck:     No masses; no thyromegaly Lungs:    Clear to auscultation bilaterally; normal respiratory effort CV:         Regular rate and rhythm; no murmurs Abd:      + bowel sounds; soft, non-tender; no palpable masses, no distension Ext:    No edema; adequate peripheral perfusion Skin:      Warm and dry; no rash Neuro: alert and oriented x 3 Psych: normal mood and affect  Data Reviewed: .  CT scan of the chest from 06/09/2017 reviewed by myself showing multiple infiltrative processes, nodular changes  .  Records from hospitalization recently reviewed  .  CT scan from 2015 was reviewed and compared with most recent   Assessment:  .  Abnormal CT scan of the chest  .  Chronic kidney disease  .  History of atrial fibrillation  .  Diabetes  .  States she has had  multiple CTs in the past that have shown varying changes from emphysema to bronchiectasis to nodules-at different times  Plan/Recommendations:  .  Findings on CT likely multifactorial, inflammatory/infectious process Less likely neoplastic process as she was not having any significant symptoms, no weight loss, maintained appetite, no chronic cough-multiplicity of findings is not consistent with a pulmonary neoplastic process Activity level is maintained  .  Repeat CT to compare with previous one is the best step forward to see very significant evolution in the current findings-resolution versus progression  .  Has not been on any antibiotics recently-has not had any symptoms suggesting an infectious process  .  Does not have any symptoms suggesting a chronic  inflammatory versus vasculitic process as well  Will follow following obtaining the CT   Sherrilyn Rist MD [facility] 10/30/2017, 11:18 AM  CC: [name], MD

## 2017-10-30 NOTE — Patient Instructions (Signed)
Abnormal CT of the chest showed multiple lung nodules  This was performed in March-no symptoms of infection at that time   Repeat CT of the chest to follow-up on the nodular changes  Comparing the repeat CT with the previous will be our best bet to be able to plan for any other intervention if needed  I will see you back in the office following obtaining the CT We will schedule an appointment for 4 weeks

## 2017-11-12 ENCOUNTER — Ambulatory Visit (INDEPENDENT_AMBULATORY_CARE_PROVIDER_SITE_OTHER)
Admission: RE | Admit: 2017-11-12 | Discharge: 2017-11-12 | Disposition: A | Discharge status: Home / Self Care | Payer: Medicare Other | Source: Ambulatory Visit | Attending: Pulmonary Disease | Admitting: Pulmonary Disease

## 2017-11-12 DIAGNOSIS — R918 Other nonspecific abnormal finding of lung field: Secondary | ICD-10-CM

## 2017-11-13 ENCOUNTER — Telehealth: Payer: Self-pay | Admitting: Pulmonary Disease

## 2017-11-13 DIAGNOSIS — R911 Solitary pulmonary nodule: Secondary | ICD-10-CM

## 2017-11-13 NOTE — Telephone Encounter (Signed)
-----   Message from Laurin Coder, MD sent at 11/13/2017  9:08 AM EDT ----- Call patient  CT with significant improvement in infiltrates, nodules Likely represents in evolving process that is improving  No changes in plan   Follow up CT in 6 months to ensure continuing improvement.

## 2017-11-27 ENCOUNTER — Ambulatory Visit (INDEPENDENT_AMBULATORY_CARE_PROVIDER_SITE_OTHER): Payer: Medicare Other | Admitting: Pulmonary Disease

## 2017-11-27 ENCOUNTER — Encounter: Payer: Self-pay | Admitting: Pulmonary Disease

## 2017-11-27 VITALS — BP 126/76 | HR 63 | Ht 66.5 in | Wt 152.2 lb

## 2017-11-27 DIAGNOSIS — R9389 Abnormal findings on diagnostic imaging of other specified body structures: Secondary | ICD-10-CM

## 2017-11-27 DIAGNOSIS — R911 Solitary pulmonary nodule: Secondary | ICD-10-CM | POA: Diagnosis not present

## 2017-11-27 NOTE — Addendum Note (Signed)
Addended by: Madolyn Frieze on: 11/27/2017 10:49 AM   Modules accepted: Orders

## 2017-11-27 NOTE — Patient Instructions (Signed)
Stable symptoms  Improved CT scan findings, nodules, scarring in the apices that appear improved compared to march 2019  Repeat CT scan in 6 months  I will see you back in the office after the CT  Call with any concerns

## 2017-11-27 NOTE — Progress Notes (Signed)
Darlene Wang    [id number]    04-17-1934  Primary Care Physician:Barnes, Benjamine Mola, MD  Referring Physician: [name], [place], [place] [address]  Chief complaint:   She has been stable with no significant symptoms History of abnormal CT scan of the chest showing multiple lung nodules-has had no significant symptoms  HPI: She denies a cough No shortness of breath Continues to be very active  Patient was hospitalized in March, she was having issues with atrial flutter, required cardioversion  She had a CT then showing multiple nodules and scarring This was repeated and its better  She stated she is always felt well Has no respiratory history no history of asthma Possible history of allergies for which she received allergy shots in the past   Never smoker  Occupation: Nursing Exposures: No mold exposure Smoking history: Never smoker  Outpatient Encounter Medications as of 11/27/2017  Medication Sig  . apixaban (ELIQUIS) 5 MG TABS tablet Take 1 tablet (5 mg total) by mouth 2 (two) times daily.  Marland Kitchen atorvastatin (LIPITOR) 40 MG tablet Take 40 mg by mouth at bedtime.  . Calcium Carbonate-Vitamin D (CALCIUM 600+D) 600-200 MG-UNIT TABS Take 600 mg by mouth daily.  . cholecalciferol (VITAMIN D) 1000 units tablet Take 1,000 Units by mouth daily.  Marland Kitchen estrogens, conjugated, (PREMARIN) 0.45 MG tablet Take 0.45 mg by mouth at bedtime.  Marland Kitchen lisinopril (PRINIVIL,ZESTRIL) 2.5 MG tablet TAKE 1 TABLET(2.5 MG) BY MOUTH DAILY  . metoprolol tartrate (LOPRESSOR) 25 MG tablet TAKE 1 TABLET(25 MG) BY MOUTH TWICE DAILY  . Multiple Vitamin (MULTIVITAMIN) capsule Take 1 capsule by mouth daily.   No facility-administered encounter medications on file as of 11/27/2017.     Allergies as of 11/27/2017 - Review Complete 11/27/2017  Allergen Reaction Noted  . Amoxicillin Swelling 01/01/2011  . Other  09/27/2013  . Pneumococcal vaccine  09/27/2013  . Tuberculin  ppd  10/20/2013    Past Medical History:  Diagnosis Date  . CKD (chronic kidney disease)    stage 3   . Diabetes mellitus without complication (HCC)    pre-diabetes   . Hypertension   . Pre-diabetes     Past Surgical History:  Procedure Laterality Date  . CARDIOVERSION N/A 06/10/2017   Procedure: CARDIOVERSION;  Surgeon: Sueanne Margarita, MD;  Location: [facility] ENDOSCOPY;  Service: Cardiovascular;  Laterality: N/A;  . TEE WITHOUT CARDIOVERSION N/A 06/10/2017   Procedure: TRANSESOPHAGEAL ECHOCARDIOGRAM (TEE);  Surgeon: Sueanne Margarita, MD;  Location: [facility] ENDOSCOPY;  Service: Cardiovascular;  Laterality: N/A;    Family History  Problem Relation Age of Onset  . Heart failure Mother   . Stroke Father   . Diabetes Father   . Stroke Sister     Social History   Socioeconomic History  . Marital status: Married    Spouse name: Not on file  . Number of children: Not on file  . Years of education: Not on file  . Highest education level: Not on file  Occupational History  . Not on file  Social Needs  . Financial resource strain: Not on file  . Food insecurity:    Worry: Not on file    Inability: Not on file  . Transportation needs:    Medical: Not on file    Non-medical: Not on file  Tobacco Use  . Smoking status: Never Smoker  . Smokeless tobacco: Never Used  Substance and Sexual Activity  . Alcohol use:  Not on file  . Drug use: Never  . Sexual activity: Not on file  Lifestyle  . Physical activity:    Days per week: Not on file    Minutes per session: Not on file  . Stress: Not on file  Relationships  . Social connections:    Talks on phone: Not on file    Gets together: Not on file    Attends religious service: Not on file    Active member of club or organization: Not on file    Attends meetings of clubs or organizations: Not on file    Relationship status: Not on file  . Intimate partner violence:    Fear of current or ex partner: Not on file    Emotionally abused:  Not on file    Physically abused: Not on file    Forced sexual activity: Not on file  Other Topics Concern  . Not on file  Social History Narrative  . Not on file    Review of systems: Review of Systems  Constitutional: Negative for fever and chills.  HENT: Negative.  No nasal stuffiness or congestion Eyes: No visual complaints Respiratory: as per HPI  Cardiovascular: Negative for chest pain and palpitations.  All other systems reviewed and are negative.  Physical Exam:  Vitals:   11/27/17 1008  BP: 126/76  Pulse: 63  SpO2: 99%   Gen:      No acute distress HEENT:  EOMI, sclera anicteric Neck:     No thyromegaly, no organomegaly Lungs:    Clear breath sounds bilaterally CV:         Regular rate and rhythm; no murmurs Abd:   Bowel sounds appreciated   Data Reviewed:  .  CT scan reviewed by myself showing improvement in infiltrative process and nodular changes  .  CT from March 2019 reviewed  .  Records from hospitalization recently reviewed  .  CT scan from 2015 was reviewed and compared with most recent   Assessment:  .  Abnormal CT scan of the chest  Chronic kidney disease  History of atrial fibrillation  Diabetes  Repeat CT scan shows improvement in findings  .  History of bronchiectasis  Plan/Recommendations:  .  CT findings likely inflammatory-improving  .  We will repeat CT in 6 months to follow-up on improving findings  .  No symptoms of an infection at present  .  Encouraged to call if any significant changes in status  .  I will see her back in the office following repeat CT  Sherrilyn Rist MD [facility] 11/27/2017, 10:41 AM  CC: [name], MD

## 2017-12-03 DIAGNOSIS — Z23 Encounter for immunization: Secondary | ICD-10-CM | POA: Diagnosis not present

## 2018-03-08 ENCOUNTER — Other Ambulatory Visit: Payer: Self-pay | Admitting: *Deleted

## 2018-03-08 MED ORDER — METOPROLOL TARTRATE 25 MG PO TABS: 25.0000 mg | ORAL_TABLET | Freq: Two times a day (BID) | ORAL | 2 refills | Status: DC

## 2018-03-08 MED ORDER — LISINOPRIL 2.5 MG PO TABS: 2.5000 mg | ORAL_TABLET | Freq: Every day | ORAL | 2 refills | Status: DC

## 2018-03-12 ENCOUNTER — Telehealth: Payer: Self-pay | Admitting: Cardiovascular Disease

## 2018-03-12 MED ORDER — APIXABAN 5 MG PO TABS: 5.0000 mg | ORAL_TABLET | Freq: Two times a day (BID) | ORAL | 1 refills | Status: DC

## 2018-03-12 NOTE — Telephone Encounter (Signed)
Refill sent in

## 2018-03-12 NOTE — Telephone Encounter (Signed)
New message      *STAT* If patient is at the pharmacy, call can be transferred to refill team.   1. Which medications need to be refilled? (please list name of each medication and dose if known) apixaban (ELIQUIS) 5 MG TABS tablet  2. Which pharmacy/location (including street and city if local pharmacy) is medication to be sent to?[facility] [place] [address]  3. Do they need a 30 day or 90 day supply? [place]

## 2018-03-15 ENCOUNTER — Other Ambulatory Visit: Payer: Self-pay

## 2018-03-15 MED ORDER — APIXABAN 5 MG PO TABS: 5.0000 mg | ORAL_TABLET | Freq: Two times a day (BID) | ORAL | 1 refills | Status: DC

## 2018-04-15 DIAGNOSIS — Z7901 Long term (current) use of anticoagulants: Secondary | ICD-10-CM | POA: Diagnosis not present

## 2018-04-15 DIAGNOSIS — M8588 Other specified disorders of bone density and structure, other site: Secondary | ICD-10-CM | POA: Diagnosis not present

## 2018-04-15 DIAGNOSIS — E7800 Pure hypercholesterolemia, unspecified: Secondary | ICD-10-CM | POA: Diagnosis not present

## 2018-04-15 DIAGNOSIS — I10 Essential (primary) hypertension: Secondary | ICD-10-CM | POA: Diagnosis not present

## 2018-04-15 DIAGNOSIS — N189 Chronic kidney disease, unspecified: Secondary | ICD-10-CM | POA: Diagnosis not present

## 2018-04-15 DIAGNOSIS — M8580 Other specified disorders of bone density and structure, unspecified site: Secondary | ICD-10-CM | POA: Diagnosis not present

## 2018-04-15 DIAGNOSIS — E119 Type 2 diabetes mellitus without complications: Secondary | ICD-10-CM | POA: Diagnosis not present

## 2018-04-15 DIAGNOSIS — L989 Disorder of the skin and subcutaneous tissue, unspecified: Secondary | ICD-10-CM | POA: Diagnosis not present

## 2018-05-05 DIAGNOSIS — H35371 Puckering of macula, right eye: Secondary | ICD-10-CM | POA: Diagnosis not present

## 2018-05-05 DIAGNOSIS — Z83511 Family history of glaucoma: Secondary | ICD-10-CM | POA: Diagnosis not present

## 2018-05-05 DIAGNOSIS — H26493 Other secondary cataract, bilateral: Secondary | ICD-10-CM | POA: Diagnosis not present

## 2018-05-05 DIAGNOSIS — R7302 Impaired glucose tolerance (oral): Secondary | ICD-10-CM | POA: Diagnosis not present

## 2018-05-05 DIAGNOSIS — H40003 Preglaucoma, unspecified, bilateral: Secondary | ICD-10-CM | POA: Diagnosis not present

## 2018-05-05 DIAGNOSIS — Z961 Presence of intraocular lens: Secondary | ICD-10-CM | POA: Diagnosis not present

## 2018-05-07 DIAGNOSIS — Z1231 Encounter for screening mammogram for malignant neoplasm of breast: Secondary | ICD-10-CM | POA: Diagnosis not present

## 2018-05-07 DIAGNOSIS — N183 Chronic kidney disease, stage 3 (moderate): Secondary | ICD-10-CM | POA: Diagnosis not present

## 2018-05-07 DIAGNOSIS — Z803 Family history of malignant neoplasm of breast: Secondary | ICD-10-CM | POA: Diagnosis not present

## 2018-05-07 DIAGNOSIS — Z90710 Acquired absence of both cervix and uterus: Secondary | ICD-10-CM | POA: Diagnosis not present

## 2018-05-21 ENCOUNTER — Ambulatory Visit (INDEPENDENT_AMBULATORY_CARE_PROVIDER_SITE_OTHER)
Admission: RE | Admit: 2018-05-21 | Discharge: 2018-05-21 | Disposition: A | Discharge status: Home / Self Care | Payer: Medicare Other | Source: Ambulatory Visit | Attending: Pulmonary Disease | Admitting: Pulmonary Disease

## 2018-05-21 DIAGNOSIS — R911 Solitary pulmonary nodule: Secondary | ICD-10-CM | POA: Diagnosis not present

## 2018-05-21 DIAGNOSIS — R918 Other nonspecific abnormal finding of lung field: Secondary | ICD-10-CM | POA: Diagnosis not present

## 2018-06-09 ENCOUNTER — Ambulatory Visit: Payer: Medicare Other | Admitting: Pulmonary Disease

## 2018-07-06 ENCOUNTER — Ambulatory Visit: Payer: Medicare Other | Admitting: Pulmonary Disease

## 2018-07-20 DIAGNOSIS — N189 Chronic kidney disease, unspecified: Secondary | ICD-10-CM | POA: Diagnosis not present

## 2018-07-20 DIAGNOSIS — D631 Anemia in chronic kidney disease: Secondary | ICD-10-CM | POA: Diagnosis not present

## 2018-07-20 DIAGNOSIS — N2581 Secondary hyperparathyroidism of renal origin: Secondary | ICD-10-CM | POA: Diagnosis not present

## 2018-07-20 DIAGNOSIS — Z0000 Encounter for general adult medical examination without abnormal findings: Secondary | ICD-10-CM | POA: Diagnosis not present

## 2018-07-20 DIAGNOSIS — E1122 Type 2 diabetes mellitus with diabetic chronic kidney disease: Secondary | ICD-10-CM | POA: Diagnosis not present

## 2018-07-20 DIAGNOSIS — N183 Chronic kidney disease, stage 3 (moderate): Secondary | ICD-10-CM | POA: Diagnosis not present

## 2018-07-20 DIAGNOSIS — I129 Hypertensive chronic kidney disease with stage 1 through stage 4 chronic kidney disease, or unspecified chronic kidney disease: Secondary | ICD-10-CM | POA: Diagnosis not present

## 2018-07-20 DIAGNOSIS — I4892 Unspecified atrial flutter: Secondary | ICD-10-CM | POA: Diagnosis not present

## 2018-07-20 DIAGNOSIS — E782 Mixed hyperlipidemia: Secondary | ICD-10-CM | POA: Diagnosis not present

## 2018-09-07 ENCOUNTER — Other Ambulatory Visit: Payer: Self-pay | Admitting: Cardiovascular Disease

## 2018-09-07 MED ORDER — APIXABAN 5 MG PO TABS: 5.0000 mg | ORAL_TABLET | Freq: Two times a day (BID) | ORAL | 0 refills | Status: DC

## 2018-09-07 NOTE — Telephone Encounter (Signed)
Pt last saw Dr Johnsie Cancel 10/05/17, pt has appt for 1 year f/u scheduled for 12/02/18.  Last labs 09/28/17 Creat 1.33, age 84, weight 69kg, based on specified criteria pt is on appropriate dosage of Eliquis 5mg  BID.  Will refill rx, will need labwork repeated at [facility].  Will place note on appt.

## 2018-09-07 NOTE — Telephone Encounter (Signed)
New Message      *STAT* If patient is at the pharmacy, call can be transferred to refill team.   1. Which medications need to be refilled? (please list name of each medication and dose if known) Eliquis   2. Which pharmacy/location (including street and city if local pharmacy) is medication to be sent to? [facility]   3. Do they need a 30 day or 90 day supply? 90 day

## 2018-10-11 ENCOUNTER — Other Ambulatory Visit: Payer: Self-pay | Admitting: Cardiovascular Disease

## 2018-10-26 ENCOUNTER — Other Ambulatory Visit: Payer: Self-pay | Admitting: Cardiovascular Disease

## 2018-10-26 NOTE — Telephone Encounter (Signed)
65f Scr 1.29 07/20/18 69kg Lovw/nishan 10/05/17 but has an appt scheduled

## 2018-11-24 NOTE — Progress Notes (Signed)
Cardiology Office Note:    Date:  12/02/2018   ID:  Darlene Wang, DOB 25-Nov-1934, MRN [id number]  PCP:  [name], MD  Cardiologist:  [name], MD   Referring MD: [name], MD   No chief complaint on file.   History of Present Illness:    84 y.o. seen in f/u for atypical flutter. 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 EF normal 60-65% mild MR/AR   Of noted, CT chest on 06/09/17 read as multilobar bronchopneumonia with recommendations for a follow up CXR in 4-6 weeks. She did not receive ABX during her admission and was afebrile without leukocytosis. She reported playing the clarinet without difficulty, and has known apical scarring. She indicates abnormal CXR;s for years   She has no bleeding problems on eliquis. ASA was D/C'ed.    No cardiac complaints    Past Medical History:  Diagnosis Date  . CKD (chronic kidney disease)    stage 3   . Diabetes mellitus without complication (HCC)    pre-diabetes   . Hypertension   . Pre-diabetes     Past Surgical History:  Procedure Laterality Date  . CARDIOVERSION N/A 06/10/2017   Procedure: CARDIOVERSION;  Surgeon: Sueanne Margarita, MD;  Location: [facility] ENDOSCOPY;  Service: Cardiovascular;  Laterality: N/A;  . TEE WITHOUT CARDIOVERSION N/A 06/10/2017   Procedure: TRANSESOPHAGEAL ECHOCARDIOGRAM (TEE);  Surgeon: Sueanne Margarita, MD;  Location: [facility] ENDOSCOPY;  Service: Cardiovascular;  Laterality: N/A;    Current Medications: Current Meds  Medication Sig  . atorvastatin (LIPITOR) 40 MG tablet Take 40 mg by mouth at bedtime.  . Calcium Carbonate-Vitamin D (CALCIUM 600+D) 600-200 MG-UNIT TABS Take 600 mg by mouth daily.  . cholecalciferol (VITAMIN D) 1000 units tablet Take 1,000 Units by mouth daily.  Marland Kitchen ELIQUIS 5 MG TABS tablet TAKE 1 TABLET BY MOUTH  TWICE DAILY  . estrogens,  conjugated, (PREMARIN) 0.45 MG tablet Take 0.45 mg by mouth at bedtime.  Marland Kitchen lisinopril (PRINIVIL,ZESTRIL) 2.5 MG tablet Take 1 tablet (2.5 mg total) by mouth daily.  . metoprolol tartrate (LOPRESSOR) 25 MG tablet TAKE 1 TABLET BY MOUTH TWO  TIMES DAILY  . Multiple Vitamin (MULTIVITAMIN) capsule Take 1 capsule by mouth daily.     Allergies:   Amoxicillin, Other, Pneumococcal vaccine, and Tuberculin ppd   Social History   Socioeconomic History  . Marital status: Married    Spouse name: Not on file  . Number of children: Not on file  . Years of education: Not on file  . Highest education level: Not on file  Occupational History  . Not on file  Social Needs  . Financial resource strain: Not on file  . Food insecurity    Worry: Not on file    Inability: Not on file  . Transportation needs    Medical: Not on file    Non-medical: Not on file  Tobacco Use  . Smoking status: Never Smoker  . Smokeless tobacco: Never Used  Substance and Sexual Activity  . Alcohol use: Not on file  . Drug use: Never  . Sexual activity: Not on file  Lifestyle  . Physical activity    Days per week: Not on file    Minutes per session: Not on file  . Stress: Not on file  Relationships  . Social Herbalist on  phone: Not on file    Gets together: Not on file    Attends religious service: Not on file    Active member of club or organization: Not on file    Attends meetings of clubs or organizations: Not on file    Relationship status: Not on file  Other Topics Concern  . Not on file  Social History Narrative  . Not on file     Family History: The patient's family history includes Diabetes in her father; Heart failure in her mother; Stroke in her father and sister.  ROS:   Please see the history of present illness.     All other systems reviewed and are negative.  EKGs/Labs/Other Studies Reviewed:    The following studies were reviewed today:  Echocardiogram 06/09/17: Study  Conclusions - Left ventricle: The cavity size was normal. Wall thickness was increased in a pattern of mild LVH. Systolic function was normal. The estimated ejection fraction was in the range of 60% to 65%.Wall motion was normal; there were no regional wall motionabnormalities. The study is not technically sufficient to allow evaluation of LV diastolic function. - Aortic valve: There was trivial regurgitation. - Mitral valve: There was mild regurgitation.  Impressions: - Normal LV function; mild LVH; trace AI; mild MR and TR.   TEE with DCCV: 06/10/17 Successful Aflutter>>NSR Results: Normal LV size and functionwith EF60-65% Normal RV size and function Normal RA Mildly dilatedLAwith spontaneous echo contrast. There is no thrombus in LAA or LA. The LAA emptying velocity is mildly reduced at 35. Normal TVwith mild to moderate TR Normal PVwith mild PR Normal MVwith mild MR Normal trileaflet AVwith mild AR Normal interatrial septum with no evidence of shunt by colorflow dopper  Normal thoracic and ascending aorta.   EKG:  12/02/18 SR rate 76 nonspecific ST changes   Recent Labs: No results found for requested labs within last 8760 hours.  Recent Lipid Panel    Component Value Date/Time   CHOL 111 06/09/2017 0432   TRIG 88 06/09/2017 0432   HDL 60 06/09/2017 0432   CHOLHDL 1.9 06/09/2017 0432   VLDL 18 06/09/2017 0432   LDLCALC 33 06/09/2017 0432    Physical Exam:    VS:  BP (!) 160/72   Pulse 76   Ht 5' 6.5" (1.689 m)   Wt 151 lb (68.5 kg)   SpO2 98%   BMI 24.01 kg/m     Wt Readings from Last 3 Encounters:  12/02/18 151 lb (68.5 kg)  11/27/17 152 lb 3.2 oz (69 kg)  10/30/17 150 lb 9.6 oz (68.3 kg)     Affect appropriate Healthy:  appears stated age 51: normal Neck supple with no adenopathy JVP normal no bruits no thyromegaly Lungs clear with no wheezing and good diaphragmatic motion Heart:  S1/S2 no murmur, no rub, gallop or click PMI normal  Abdomen: benighn, BS positve, no tenderness, no AAA no bruit.  No HSM or HJR Distal pulses intact with no bruits No edema Neuro non-focal Skin warm and dry No muscular weakness   ASSESSMENT:    Atrial flutter  PLAN:    In order of problems listed above:  Flutter:  Post TEE/DCC 06/10/17  Initially On low dose eliquis given age and CKD Digoxin d/c last visit Continue beta blocker most recent CR on 09/28/17 1.33 with GFR 38 transitioned to 5 bid dose   CKD (chronic kidney disease) stage 3  CR 1.3 09/28/17 f/u with nephrology  Essential hypertension   Pressures  well-controlled on current regimen. No medication changes.  Pre-diabetes A1c was 5.7.   Pulmonary:  CT chest 06/09/17 with chronic small nodules apical scarring ? Pneumonia not clinically evident with history of chronically abnormal CXR Non smoker normal exam chronic abnormality f/u primary no need for more testing at this time    [name]

## 2018-11-29 DIAGNOSIS — M25561 Pain in right knee: Secondary | ICD-10-CM | POA: Diagnosis not present

## 2018-11-29 DIAGNOSIS — I4891 Unspecified atrial fibrillation: Secondary | ICD-10-CM | POA: Diagnosis not present

## 2018-11-29 DIAGNOSIS — N189 Chronic kidney disease, unspecified: Secondary | ICD-10-CM | POA: Diagnosis not present

## 2018-11-29 DIAGNOSIS — Z7901 Long term (current) use of anticoagulants: Secondary | ICD-10-CM | POA: Diagnosis not present

## 2018-11-29 DIAGNOSIS — E7800 Pure hypercholesterolemia, unspecified: Secondary | ICD-10-CM | POA: Diagnosis not present

## 2018-11-29 DIAGNOSIS — E1122 Type 2 diabetes mellitus with diabetic chronic kidney disease: Secondary | ICD-10-CM | POA: Diagnosis not present

## 2018-11-29 DIAGNOSIS — M8580 Other specified disorders of bone density and structure, unspecified site: Secondary | ICD-10-CM | POA: Diagnosis not present

## 2018-12-02 ENCOUNTER — Ambulatory Visit (INDEPENDENT_AMBULATORY_CARE_PROVIDER_SITE_OTHER): Payer: Medicare Other | Admitting: Cardiovascular Disease

## 2018-12-02 ENCOUNTER — Other Ambulatory Visit: Payer: Self-pay

## 2018-12-02 ENCOUNTER — Encounter: Payer: Self-pay | Admitting: Cardiovascular Disease

## 2018-12-02 VITALS — BP 160/72 | HR 76 | Ht 66.5 in | Wt 151.0 lb

## 2018-12-02 DIAGNOSIS — I483 Typical atrial flutter: Secondary | ICD-10-CM

## 2018-12-02 NOTE — Patient Instructions (Signed)

## 2018-12-03 DIAGNOSIS — M25561 Pain in right knee: Secondary | ICD-10-CM | POA: Diagnosis not present

## 2018-12-03 DIAGNOSIS — M25571 Pain in right ankle and joints of right foot: Secondary | ICD-10-CM | POA: Diagnosis not present

## 2018-12-03 DIAGNOSIS — M1711 Unilateral primary osteoarthritis, right knee: Secondary | ICD-10-CM | POA: Diagnosis not present

## 2018-12-09 DIAGNOSIS — Z23 Encounter for immunization: Secondary | ICD-10-CM | POA: Diagnosis not present

## 2018-12-17 DIAGNOSIS — I4891 Unspecified atrial fibrillation: Secondary | ICD-10-CM | POA: Diagnosis not present

## 2018-12-17 DIAGNOSIS — M25561 Pain in right knee: Secondary | ICD-10-CM | POA: Diagnosis not present

## 2018-12-17 DIAGNOSIS — E1122 Type 2 diabetes mellitus with diabetic chronic kidney disease: Secondary | ICD-10-CM | POA: Diagnosis not present

## 2018-12-17 DIAGNOSIS — M8580 Other specified disorders of bone density and structure, unspecified site: Secondary | ICD-10-CM | POA: Diagnosis not present

## 2018-12-17 DIAGNOSIS — E7800 Pure hypercholesterolemia, unspecified: Secondary | ICD-10-CM | POA: Diagnosis not present

## 2018-12-17 DIAGNOSIS — N189 Chronic kidney disease, unspecified: Secondary | ICD-10-CM | POA: Diagnosis not present

## 2018-12-17 DIAGNOSIS — Z7901 Long term (current) use of anticoagulants: Secondary | ICD-10-CM | POA: Diagnosis not present

## 2019-01-22 ENCOUNTER — Other Ambulatory Visit: Payer: Self-pay | Admitting: Cardiovascular Disease

## 2019-02-08 DIAGNOSIS — D631 Anemia in chronic kidney disease: Secondary | ICD-10-CM | POA: Diagnosis not present

## 2019-02-08 DIAGNOSIS — E782 Mixed hyperlipidemia: Secondary | ICD-10-CM | POA: Diagnosis not present

## 2019-02-08 DIAGNOSIS — N183 Chronic kidney disease, stage 3 unspecified: Secondary | ICD-10-CM | POA: Diagnosis not present

## 2019-02-08 DIAGNOSIS — I4892 Unspecified atrial flutter: Secondary | ICD-10-CM | POA: Diagnosis not present

## 2019-02-08 DIAGNOSIS — E1122 Type 2 diabetes mellitus with diabetic chronic kidney disease: Secondary | ICD-10-CM | POA: Diagnosis not present

## 2019-02-08 DIAGNOSIS — Z0000 Encounter for general adult medical examination without abnormal findings: Secondary | ICD-10-CM | POA: Diagnosis not present

## 2019-02-08 DIAGNOSIS — I129 Hypertensive chronic kidney disease with stage 1 through stage 4 chronic kidney disease, or unspecified chronic kidney disease: Secondary | ICD-10-CM | POA: Diagnosis not present

## 2019-02-08 DIAGNOSIS — N2581 Secondary hyperparathyroidism of renal origin: Secondary | ICD-10-CM | POA: Diagnosis not present

## 2019-03-01 DIAGNOSIS — N183 Chronic kidney disease, stage 3 unspecified: Secondary | ICD-10-CM | POA: Diagnosis not present

## 2019-03-01 DIAGNOSIS — I129 Hypertensive chronic kidney disease with stage 1 through stage 4 chronic kidney disease, or unspecified chronic kidney disease: Secondary | ICD-10-CM | POA: Diagnosis not present

## 2019-03-30 DIAGNOSIS — Z23 Encounter for immunization: Secondary | ICD-10-CM | POA: Diagnosis not present

## 2019-04-26 DIAGNOSIS — Z23 Encounter for immunization: Secondary | ICD-10-CM | POA: Diagnosis not present

## 2019-05-17 DIAGNOSIS — M1711 Unilateral primary osteoarthritis, right knee: Secondary | ICD-10-CM | POA: Diagnosis not present

## 2019-05-17 DIAGNOSIS — M25571 Pain in right ankle and joints of right foot: Secondary | ICD-10-CM | POA: Diagnosis not present

## 2019-05-17 DIAGNOSIS — M25561 Pain in right knee: Secondary | ICD-10-CM | POA: Diagnosis not present

## 2019-05-20 DIAGNOSIS — Z1231 Encounter for screening mammogram for malignant neoplasm of breast: Secondary | ICD-10-CM | POA: Diagnosis not present

## 2019-05-25 DIAGNOSIS — H40013 Open angle with borderline findings, low risk, bilateral: Secondary | ICD-10-CM | POA: Diagnosis not present

## 2019-05-25 DIAGNOSIS — H35371 Puckering of macula, right eye: Secondary | ICD-10-CM | POA: Diagnosis not present

## 2019-05-25 DIAGNOSIS — Z961 Presence of intraocular lens: Secondary | ICD-10-CM | POA: Diagnosis not present

## 2019-05-25 DIAGNOSIS — R7303 Prediabetes: Secondary | ICD-10-CM | POA: Diagnosis not present

## 2019-05-25 NOTE — Progress Notes (Signed)
Cardiology Office Note:    Date:  06/08/2019   ID:  Darlene Wang, DOB Dec 11, 1934, MRN [id number]  PCP:  [name], MD  Cardiologist:  [name], MD   Referring MD: [name], MD   No chief complaint on file.   History of Present Illness:    85 y.o. seen in f/u for atypical flutter. 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 EF normal 60-65% mild MR/AR   Of noted, CT chest on 06/09/17 read as multilobar bronchopneumonia with recommendations for a follow up CXR in 4-6 weeks. She did not receive ABX during her admission and was afebrile without leukocytosis. She reported playing the clarinet without difficulty, and has known apical scarring. She indicates abnormal CXR;s for years Last CT 05/21/18 showed more LUL consolidation    She has no bleeding problems on eliquis. ASA was D/C'ed.  Eliquis dose is borderline for reduction to 2.5 mg bid Dosing with GFR 36 and Cr 1.46 with age over 46   No cardiac complaints  Her and [facility] at [facility] Has had vaccine   Past Medical History:  Diagnosis Date  . CKD (chronic kidney disease)    stage 3   . Diabetes mellitus without complication (HCC)    pre-diabetes   . Hypertension   . Pre-diabetes     Past Surgical History:  Procedure Laterality Date  . CARDIOVERSION N/A 06/10/2017   Procedure: CARDIOVERSION;  Surgeon: Sueanne Margarita, MD;  Location: [facility] ENDOSCOPY;  Service: Cardiovascular;  Laterality: N/A;  . TEE WITHOUT CARDIOVERSION N/A 06/10/2017   Procedure: TRANSESOPHAGEAL ECHOCARDIOGRAM (TEE);  Surgeon: Sueanne Margarita, MD;  Location: [facility] ENDOSCOPY;  Service: Cardiovascular;  Laterality: N/A;    Current Medications: Current Meds  Medication Sig  . atorvastatin (LIPITOR) 40 MG tablet Take 40 mg by mouth at bedtime.  . Calcium Carbonate-Vitamin D (CALCIUM 600+D)  600-200 MG-UNIT TABS Take 600 mg by mouth daily.  . cholecalciferol (VITAMIN D) 1000 units tablet Take 1,000 Units by mouth daily.  . dapagliflozin propanediol (FARXIGA) 5 MG TABS tablet Take 5 mg by mouth daily.  Marland Kitchen ELIQUIS 5 MG TABS tablet TAKE 1 TABLET BY MOUTH  TWICE DAILY  . estrogens, conjugated, (PREMARIN) 0.45 MG tablet Take 0.45 mg by mouth at bedtime.  Marland Kitchen lisinopril (PRINIVIL,ZESTRIL) 2.5 MG tablet Take 1 tablet (2.5 mg total) by mouth daily.  . metoprolol tartrate (LOPRESSOR) 25 MG tablet TAKE 1 TABLET BY MOUTH  TWICE DAILY  . Multiple Vitamin (MULTIVITAMIN) capsule Take 1 capsule by mouth daily.     Allergies:   Amoxicillin, Other, Pneumococcal vac polyvalent, Pneumococcal vaccine, and Tuberculin ppd   Social History   Socioeconomic History  . Marital status: Married    Spouse name: Not on file  . Number of children: Not on file  . Years of education: Not on file  . Highest education level: Not on file  Occupational History  . Not on file  Tobacco Use  . Smoking status: Never Smoker  . Smokeless tobacco: Never Used  Substance and Sexual Activity  . Alcohol use: Not on file  . Drug use: Never  . Sexual activity: Not on file  Other Topics Concern  . Not on file  Social History Narrative  . Not on file   Social Determinants of Health   Financial Resource Strain:   .  Difficulty of Paying Living Expenses:   Food Insecurity:   . Worried About Charity fundraiser in the Last Year:   . Arboriculturist in the Last Year:   Transportation Needs:   . Film/video editor (Medical):   Marland Kitchen Lack of Transportation (Non-Medical):   Physical Activity:   . Days of Exercise per Week:   . Minutes of Exercise per Session:   Stress:   . Feeling of Stress :   Social Connections:   . Frequency of Communication with Friends and Family:   . Frequency of Social Gatherings with Friends and Family:   . Attends Religious Services:   . Active Member of Clubs or Organizations:   .  Attends Archivist Meetings:   Marland Kitchen Marital Status:      Family History: The patient's family history includes Diabetes in her father; Heart failure in her mother; Stroke in her father and sister.  ROS:   Please see the history of present illness.     All other systems reviewed and are negative.  EKGs/Labs/Other Studies Reviewed:    The following studies were reviewed today:  Echocardiogram 06/09/17: Study Conclusions - Left ventricle: The cavity size was normal. Wall thickness was increased in a pattern of mild LVH. Systolic function was normal. The estimated ejection fraction was in the range of 60% to 65%.Wall motion was normal; there were no regional wall motionabnormalities. The study is not technically sufficient to allow evaluation of LV diastolic function. - Aortic valve: There was trivial regurgitation. - Mitral valve: There was mild regurgitation.  Impressions: - Normal LV function; mild LVH; trace AI; mild MR and TR.   TEE with DCCV: 06/10/17 Successful Aflutter>>NSR Results: Normal LV size and functionwith EF60-65% Normal RV size and function Normal RA Mildly dilatedLAwith spontaneous echo contrast. There is no thrombus in LAA or LA. The LAA emptying velocity is mildly reduced at 35. Normal TVwith mild to moderate TR Normal PVwith mild PR Normal MVwith mild MR Normal trileaflet AVwith mild AR Normal interatrial septum with no evidence of shunt by colorflow dopper  Normal thoracic and ascending aorta.   EKG:  12/02/18 SR rate 76 nonspecific ST changes   Recent Labs: No results found for requested labs within last 8760 hours.  Recent Lipid Panel    Component Value Date/Time   CHOL 111 06/09/2017 0432   TRIG 88 06/09/2017 0432   HDL 60 06/09/2017 0432   CHOLHDL 1.9 06/09/2017 0432   VLDL 18 06/09/2017 0432   LDLCALC 33 06/09/2017 0432    Physical Exam:    VS:  BP (!) 166/86   Pulse 60   Ht 5' 6.5" (1.689 m)   Wt 149 lb (67.6  kg)   SpO2 98%   BMI 23.69 kg/m     Wt Readings from Last 3 Encounters:  06/08/19 149 lb (67.6 kg)  12/02/18 151 lb (68.5 kg)  11/27/17 152 lb 3.2 oz (69 kg)     Affect appropriate Healthy:  appears stated age 33: normal Neck supple with no adenopathy JVP normal no bruits no thyromegaly Lungs clear with no wheezing and good diaphragmatic motion Heart:  S1/S2 no murmur, no rub, gallop or click PMI normal Abdomen: benighn, BS positve, no tenderness, no AAA no bruit.  No HSM or HJR Distal pulses intact with no bruits No edema Neuro non-focal Skin warm and dry No muscular weakness   ASSESSMENT:    Atrial flutter  PLAN:    In order  of problems listed above:  Flutter:  Post TEE/DCC 06/10/17  Initially On low dose eliquis given age and CKD Digoxin d/c last visit Continue beta blocker most recent CR on 12/17/18 1.42 GFR 36   CKD (chronic kidney disease) stage 3  CR 1.46 GFR 26  Stable f/u with Deiderding   Essential hypertension   Pressures well-controlled on current regimen. No medication changes.  Pre-diabetes  A1c 6.1 diet Rx per primary   Pulmonary:  Chronically abnormal CXR/CT last CT done 05/21/18 with slight increase in LUL consolidation Radiology  Recommended f/u CT will defer to primary f/u   Ordered:  None   [name]

## 2019-06-08 ENCOUNTER — Encounter: Payer: Self-pay | Admitting: Cardiovascular Disease

## 2019-06-08 ENCOUNTER — Other Ambulatory Visit: Payer: Self-pay

## 2019-06-08 ENCOUNTER — Ambulatory Visit (INDEPENDENT_AMBULATORY_CARE_PROVIDER_SITE_OTHER): Payer: Medicare Other | Admitting: Cardiovascular Disease

## 2019-06-08 VITALS — BP 166/86 | HR 60 | Ht 66.5 in | Wt 149.0 lb

## 2019-06-08 DIAGNOSIS — I480 Paroxysmal atrial fibrillation: Secondary | ICD-10-CM | POA: Diagnosis not present

## 2019-06-08 NOTE — Patient Instructions (Addendum)
Medication Instructions:  NONE *If you need a refill on your cardiac medications before your next appointment, please call your pharmacy*   Lab Work: NONE If you have labs (blood work) drawn today and your tests are completely normal, you will receive your results only by: Marland Kitchen MyChart Message (if you have MyChart) OR . A paper copy in the mail If you have any lab test that is abnormal or we need to change your treatment, we will call you to review the results.   Testing/Procedures: NONE   Follow-Up: At [facility], you and your health needs are our priority.  As part of our continuing mission to provide you with exceptional heart care, we have created designated Provider Care Teams.  These Care Teams include your primary Cardiologist (physician) and Advanced Practice Providers (APPs -  Physician Assistants and Nurse Practitioners) who all work together to provide you with the care you need, when you need it.  Your next appointment:   6 months  The format for your next appointment:   Either In Person or Virtual  Provider:   Dr Johnsie Cancel   Other Instructions

## 2019-06-13 DIAGNOSIS — Z5189 Encounter for other specified aftercare: Secondary | ICD-10-CM | POA: Diagnosis not present

## 2019-07-21 DIAGNOSIS — Z8679 Personal history of other diseases of the circulatory system: Secondary | ICD-10-CM | POA: Diagnosis not present

## 2019-07-21 DIAGNOSIS — Z79890 Hormone replacement therapy (postmenopausal): Secondary | ICD-10-CM | POA: Diagnosis not present

## 2019-07-21 DIAGNOSIS — E1122 Type 2 diabetes mellitus with diabetic chronic kidney disease: Secondary | ICD-10-CM | POA: Diagnosis not present

## 2019-07-21 DIAGNOSIS — I10 Essential (primary) hypertension: Secondary | ICD-10-CM | POA: Diagnosis not present

## 2019-07-21 DIAGNOSIS — E7800 Pure hypercholesterolemia, unspecified: Secondary | ICD-10-CM | POA: Diagnosis not present

## 2019-07-21 DIAGNOSIS — Z7901 Long term (current) use of anticoagulants: Secondary | ICD-10-CM | POA: Diagnosis not present

## 2019-07-21 DIAGNOSIS — Z0000 Encounter for general adult medical examination without abnormal findings: Secondary | ICD-10-CM | POA: Diagnosis not present

## 2019-07-21 DIAGNOSIS — R030 Elevated blood-pressure reading, without diagnosis of hypertension: Secondary | ICD-10-CM | POA: Diagnosis not present

## 2019-07-21 DIAGNOSIS — N189 Chronic kidney disease, unspecified: Secondary | ICD-10-CM | POA: Diagnosis not present

## 2019-07-21 DIAGNOSIS — M8588 Other specified disorders of bone density and structure, other site: Secondary | ICD-10-CM | POA: Diagnosis not present

## 2019-08-17 DIAGNOSIS — E782 Mixed hyperlipidemia: Secondary | ICD-10-CM | POA: Diagnosis not present

## 2019-08-17 DIAGNOSIS — Z0000 Encounter for general adult medical examination without abnormal findings: Secondary | ICD-10-CM | POA: Diagnosis not present

## 2019-08-17 DIAGNOSIS — N189 Chronic kidney disease, unspecified: Secondary | ICD-10-CM | POA: Diagnosis not present

## 2019-08-17 DIAGNOSIS — N1832 Chronic kidney disease, stage 3b: Secondary | ICD-10-CM | POA: Diagnosis not present

## 2019-08-17 DIAGNOSIS — D631 Anemia in chronic kidney disease: Secondary | ICD-10-CM | POA: Diagnosis not present

## 2019-08-17 DIAGNOSIS — I4892 Unspecified atrial flutter: Secondary | ICD-10-CM | POA: Diagnosis not present

## 2019-08-17 DIAGNOSIS — N2581 Secondary hyperparathyroidism of renal origin: Secondary | ICD-10-CM | POA: Diagnosis not present

## 2019-08-17 DIAGNOSIS — E1122 Type 2 diabetes mellitus with diabetic chronic kidney disease: Secondary | ICD-10-CM | POA: Diagnosis not present

## 2019-08-17 DIAGNOSIS — I129 Hypertensive chronic kidney disease with stage 1 through stage 4 chronic kidney disease, or unspecified chronic kidney disease: Secondary | ICD-10-CM | POA: Diagnosis not present

## 2019-09-01 DIAGNOSIS — E7800 Pure hypercholesterolemia, unspecified: Secondary | ICD-10-CM | POA: Diagnosis not present

## 2019-09-01 DIAGNOSIS — E1122 Type 2 diabetes mellitus with diabetic chronic kidney disease: Secondary | ICD-10-CM | POA: Diagnosis not present

## 2019-09-01 DIAGNOSIS — I10 Essential (primary) hypertension: Secondary | ICD-10-CM | POA: Diagnosis not present

## 2019-09-01 DIAGNOSIS — N2581 Secondary hyperparathyroidism of renal origin: Secondary | ICD-10-CM | POA: Diagnosis not present

## 2019-09-01 DIAGNOSIS — D631 Anemia in chronic kidney disease: Secondary | ICD-10-CM | POA: Diagnosis not present

## 2019-09-01 DIAGNOSIS — N189 Chronic kidney disease, unspecified: Secondary | ICD-10-CM | POA: Diagnosis not present

## 2019-09-01 DIAGNOSIS — I4891 Unspecified atrial fibrillation: Secondary | ICD-10-CM | POA: Diagnosis not present

## 2019-10-25 IMAGING — CT CT CHEST W/O CM
2 of 3 series · 15 of 36 positions shown, 18 images · non-contrast
Comparison: Chest radiograph from one day prior. 09/02/2013 chest
CT.

CLINICAL DATA: Bilateral lung opacities on chest radiograph
performed for tachycardia. Nonsmoker.

EXAM:
CT CHEST WITHOUT CONTRAST
TECHNIQUE: Multidetector CT imaging of the chest was performed following the
standard protocol without IV contrast.

[Series 4: thorax 2.0 · axial · 0.62mm/px · z∈[+1119,+1395]mm · 12 of 162 slices shown, 15 images]
[im 12/162  mediastinal]
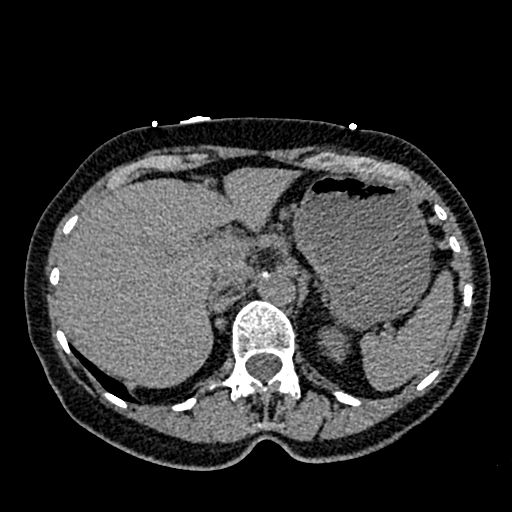
[im 12/162  lung]
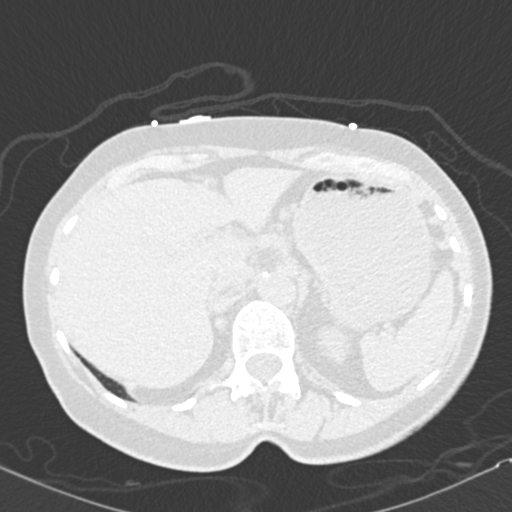
[im 24/162  lung]
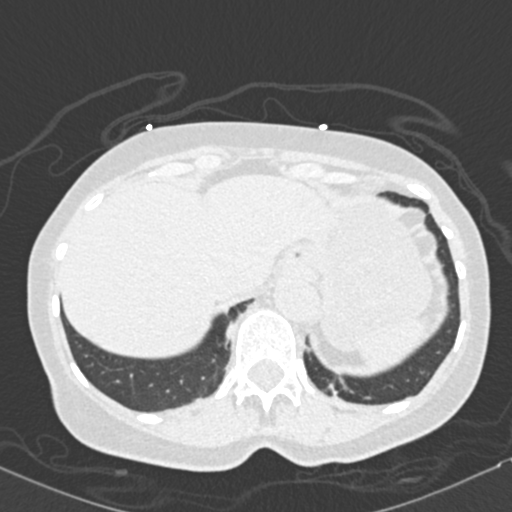
[im 36/162  lung]
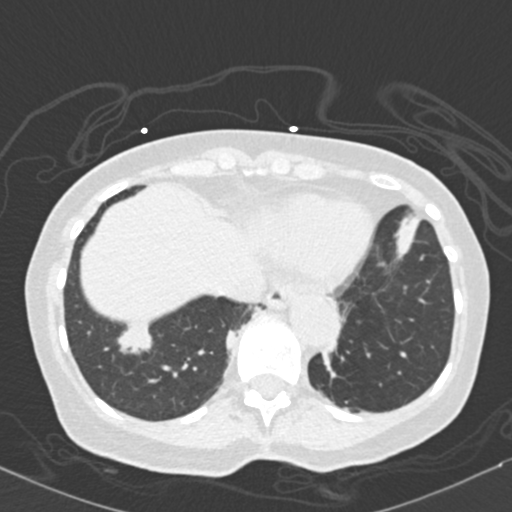
[im 48/162  lung]
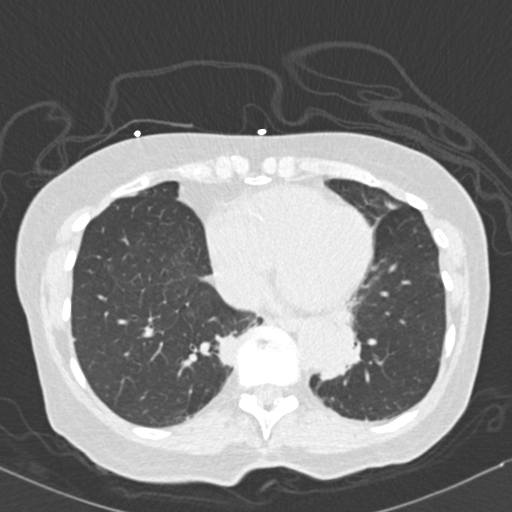
[im 60/162  mediastinal]
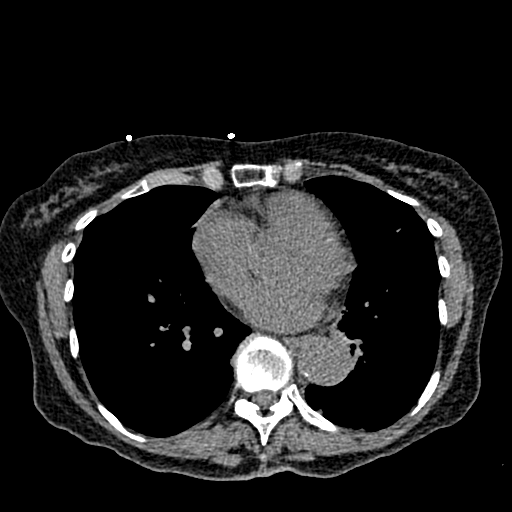
[im 60/162  lung]
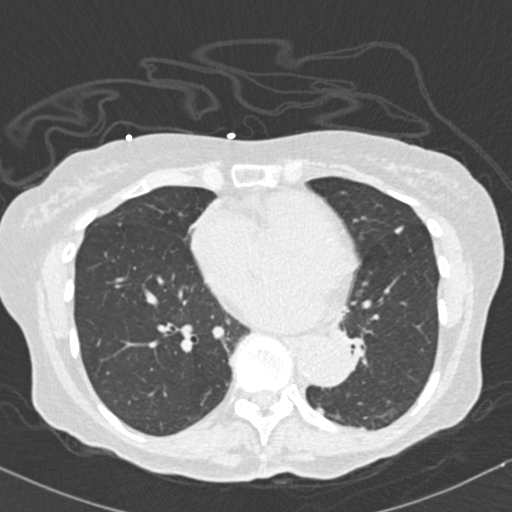
[im 72/162  lung]
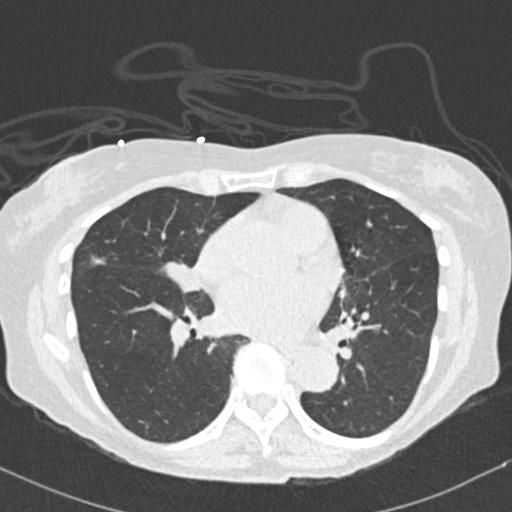
[im 90/162  lung]
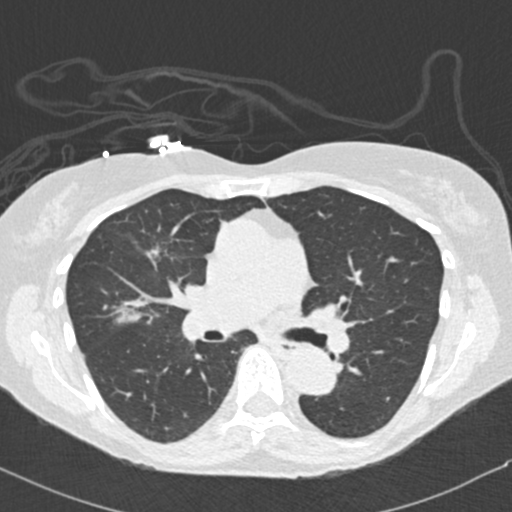
[im 102/162  lung]
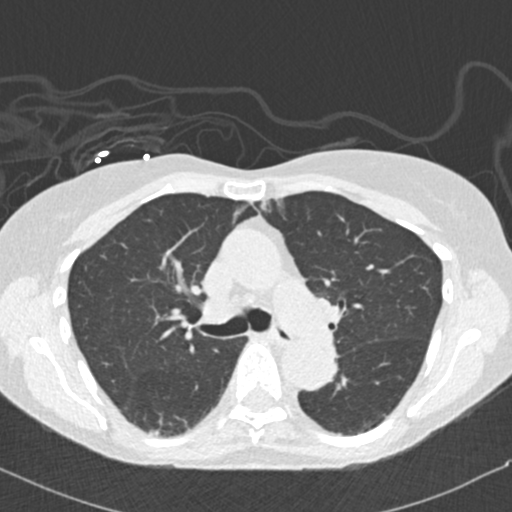
[im 114/162  mediastinal]
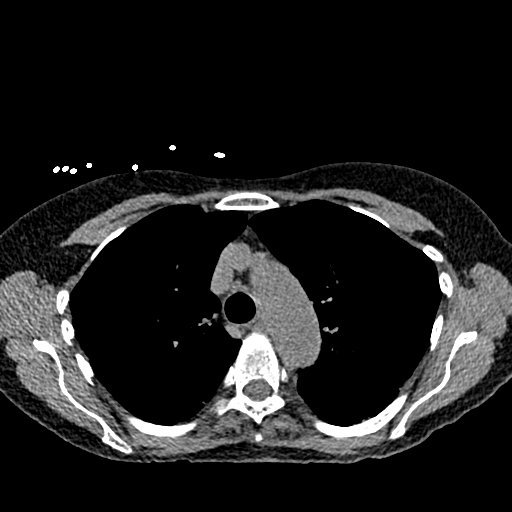
[im 114/162  lung]
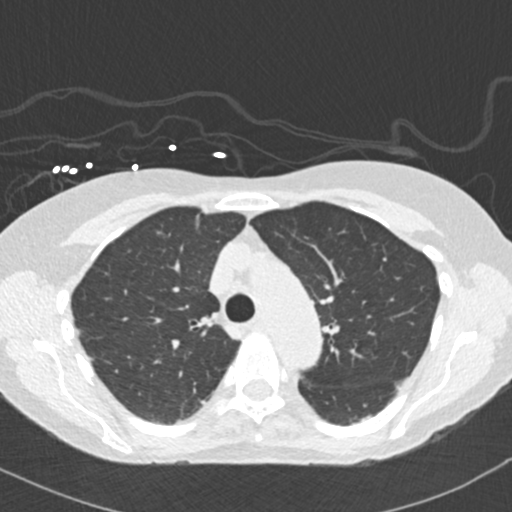
[im 126/162  lung]
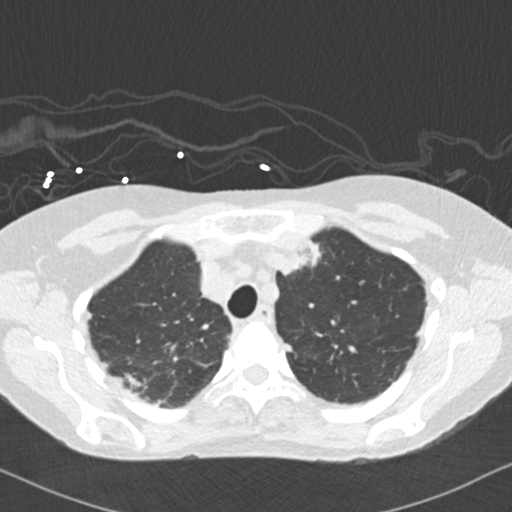
[im 138/162  lung]
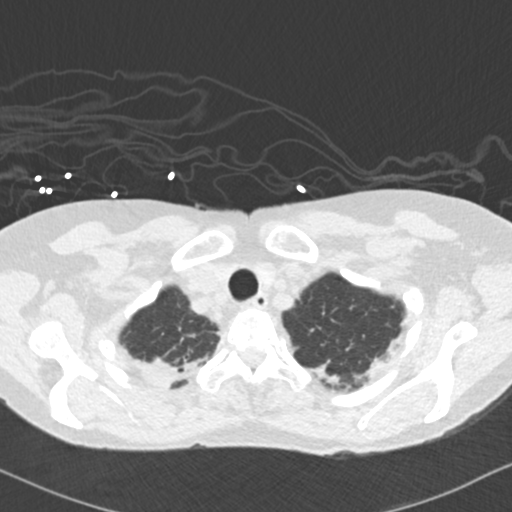
[im 150/162  lung]
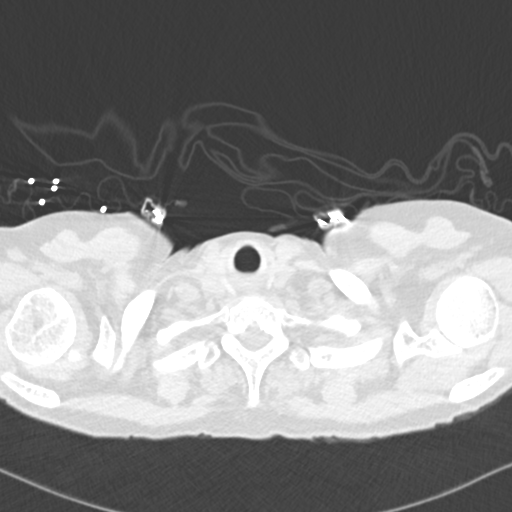

[Series 6: coronal · coronal · 0.56mm/px · 3 of 79 slices shown]
[im 16/79  lung]
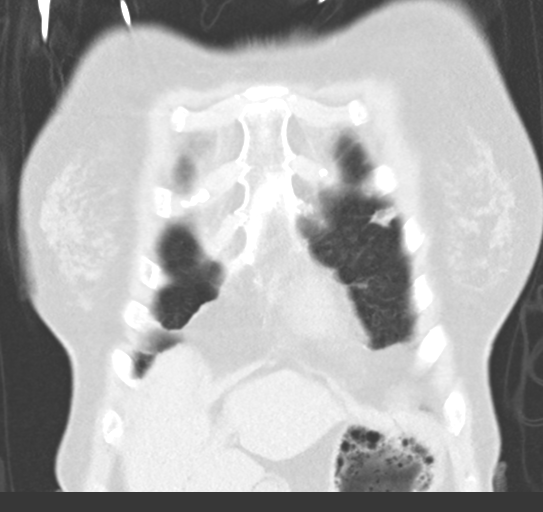
[im 32/79  lung]
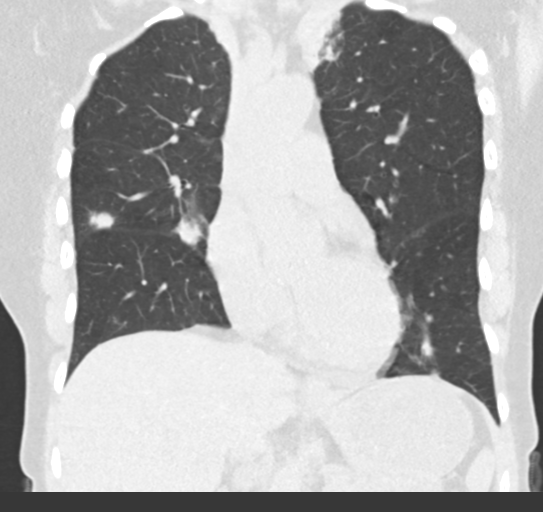
[im 47/79  lung]
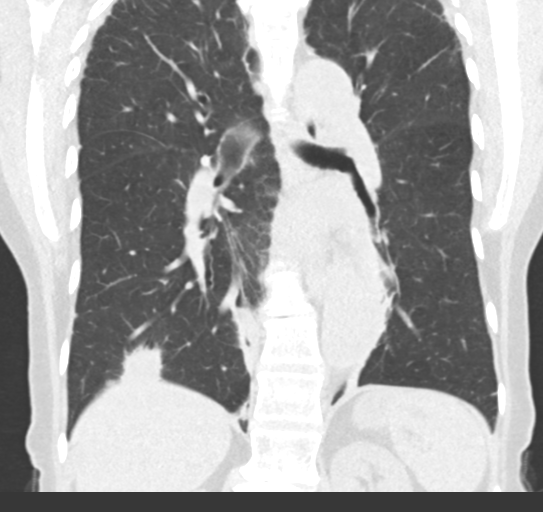

[15 of 36 positions shown; findings below may reference images not displayed]

FINDINGS: Cardiovascular: Normal heart size. No significant pericardial
fluid/thickening. Atherosclerotic nonaneurysmal thoracic aorta.
Normal caliber pulmonary arteries.

Mediastinum/Nodes: No discrete thyroid nodules. Unremarkable
esophagus. No pathologically enlarged axillary, mediastinal or gross
hilar lymph nodes, noting limited sensitivity for the detection of
hilar adenopathy on this noncontrast study.

Lungs/Pleura: No pneumothorax. No pleural effusion. There are
numerous new patchy nodular peribronchovascular foci of
consolidation scattered throughout both lungs involving all of the
lung lobes, with representative 2.2 x 1.9 cm basilar right lower
lobe (series 8/image 128) and anteromedial left upper lobe 2.7 x
cm (series 8/image 41) nodular foci of peribronchovascular
consolidation. Bandlike pleural-parenchymal scarring in the
posterior biapical lungs is stable. Of the previously visualized
small solid pulmonary nodules scattered in both lungs on the
09/02/2013 chest CT are stable, largest 4 mm in the posterior right
lower lobe (series 8/image 99), considered benign.

Upper abdomen: Cholelithiasis.

Musculoskeletal: No aggressive appearing focal osseous lesions. Mild
thoracic spondylosis.
IMPRESSION: 1. New patchy nodular peribronchovascular foci of consolidation
throughout both lungs, most compatible with a multilobar
bronchopneumonia. Recommend follow-up PA and lateral post treatment
chest radiographs in 4-6 weeks.
2. Previously visualized scattered small solid pulmonary nodules on
the 09/02/2013 chest CT are stable and considered benign.
3. Stable chronic biapical pleural-parenchymal scarring.
4. Cholelithiasis.

Aortic Atherosclerosis (CHETP-EQP.P).

## 2019-11-10 ENCOUNTER — Other Ambulatory Visit: Payer: Self-pay | Admitting: Cardiovascular Disease

## 2019-11-10 NOTE — Telephone Encounter (Signed)
Pt last saw Dr Johnsie Cancel 06/08/19, last labs 07/21/19 Creat 1.46, age 85, weight 67.6kg, based on specified criteria pt is on appropriate dosage of Eliquis 5mg  BID.  Will refill rx.

## 2019-12-16 ENCOUNTER — Encounter: Payer: Self-pay | Admitting: Cardiovascular Disease

## 2019-12-16 ENCOUNTER — Ambulatory Visit (INDEPENDENT_AMBULATORY_CARE_PROVIDER_SITE_OTHER): Payer: Medicare Other | Admitting: Cardiovascular Disease

## 2019-12-16 ENCOUNTER — Other Ambulatory Visit: Payer: Self-pay

## 2019-12-16 VITALS — BP 176/70 | HR 64 | Ht 66.5 in | Wt 145.8 lb

## 2019-12-16 DIAGNOSIS — I4892 Unspecified atrial flutter: Secondary | ICD-10-CM | POA: Diagnosis not present

## 2019-12-16 DIAGNOSIS — J849 Interstitial pulmonary disease, unspecified: Secondary | ICD-10-CM | POA: Diagnosis not present

## 2019-12-16 NOTE — Patient Instructions (Addendum)
Medication Instructions:  *If you need a refill on your cardiac medications before your next appointment, please call your pharmacy*  Lab Work: If you have labs (blood work) drawn today and your tests are completely normal, you will receive your results only by: Marland Kitchen MyChart Message (if you have MyChart) OR . A paper copy in the mail If you have any lab test that is abnormal or we need to change your treatment, we will call you to review the results.  Testing/Procedures: Non-Cardiac CT scanning, (CAT scanning), is a noninvasive, special x-ray that produces cross-sectional images of the body using x-rays and a computer. CT scans help physicians diagnose and treat medical conditions. For some CT exams, a contrast material is used to enhance visibility in the area of the body being studied. CT scans provide greater clarity and reveal more details than regular x-ray exams.  Follow-Up: At [facility], you and your health needs are our priority.  As part of our continuing mission to provide you with exceptional heart care, we have created designated Provider Care Teams.  These Care Teams include your primary Cardiologist (physician) and Advanced Practice Providers (APPs -  Physician Assistants and Nurse Practitioners) who all work together to provide you with the care you need, when you need it.  We recommend signing up for the patient portal called "MyChart".  Sign up information is provided on this After Visit Summary.  MyChart is used to connect with patients for Virtual Visits (Telemedicine).  Patients are able to view lab/test results, encounter notes, upcoming appointments, etc.  Non-urgent messages can be sent to your provider as well.   To learn more about what you can do with MyChart, go to [email or web address].    Your next appointment:   6 month(s)  The format for your next appointment:   In Person  Provider:   You may see [name], MD or one of the following Advanced Practice  Providers on your designated Care Team:    Truitt Merle, NP  Cecilie Kicks, NP  Kathyrn Drown, NP  You have been referred to pulmonology.

## 2019-12-16 NOTE — Progress Notes (Signed)
Cardiology Office Note:    Date:  12/16/2019   ID:  Darlene Wang, DOB [birth year], MRN [id number]  PCP:  [name], MD  Cardiologist:  [name], MD   Referring MD: [name], MD   No chief complaint on file.   History of Present Illness:    85 y.o. seen in f/u for atypical flutter. 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 EF normal 60-65% mild MR/AR   Of noted, CT chest on 06/09/17 read as multilobar bronchopneumonia with recommendations for a follow up CXR in 4-6 weeks. She did not receive ABX during her admission and was afebrile without leukocytosis. She reported playing the clarinet without difficulty, and has known apical scarring. She indicates abnormal CXR;s for years Last CT 05/21/18 showed more LUL consolidation    She has no bleeding problems on eliquis. ASA was D/C'ed.  Eliquis dose is borderline for reduction to 2.5 mg bid Dosing with GFR 36 and Cr 1.46 with age over 52   No cardiac complaints  Her and [facility] at [facility] Has had vaccine   She has not had close enough pulmonary f/u and is overdue for CT She has no dyspnea cough sputum. She plays woodwinds/clarinet for hours And sings in choir without dyspnea  Past Medical History:  Diagnosis Date   CKD (chronic kidney disease)    stage 3    Diabetes mellitus without complication ([place])    pre-diabetes    Hypertension    Pre-diabetes     Past Surgical History:  Procedure Laterality Date   CARDIOVERSION N/A 06/10/2017   Procedure: CARDIOVERSION;  Surgeon: Sueanne Margarita, MD;  Location: [place];  Service: Cardiovascular;  Laterality: N/A;   TEE WITHOUT CARDIOVERSION N/A 06/10/2017   Procedure: TRANSESOPHAGEAL ECHOCARDIOGRAM (TEE);  Surgeon: Sueanne Margarita, MD;  Location: [facility] ENDOSCOPY;  Service: Cardiovascular;  Laterality: N/A;     Current Medications: Current Meds  Medication Sig   atorvastatin (LIPITOR) 40 MG tablet Take 40 mg by mouth at bedtime.   Calcium Carbonate-Vitamin D (CALCIUM 600+D) 600-200 MG-UNIT TABS Take 600 mg by mouth every other day.    cholecalciferol (VITAMIN D) 1000 units tablet Take 1,000 Units by mouth daily.   dapagliflozin propanediol (FARXIGA) 5 MG TABS tablet Take 5 mg by mouth daily.   ELIQUIS 5 MG TABS tablet TAKE 1 TABLET BY MOUTH  TWICE DAILY   estrogens, conjugated, (PREMARIN) 0.45 MG tablet Take 0.45 mg by mouth at bedtime.   lisinopril (ZESTRIL) 5 MG tablet Take 5 mg by mouth daily.   metoprolol tartrate (LOPRESSOR) 25 MG tablet TAKE 1 TABLET BY MOUTH  TWICE DAILY   Multiple Vitamin (MULTIVITAMIN) capsule Take 1 capsule by mouth daily.     Allergies:   Amoxicillin, Other, Pneumococcal vac polyvalent, Pneumococcal vaccine, and Tuberculin ppd   Social History   Socioeconomic History   Marital status: Married    Spouse name: Not on file   Number of children: Not on file   Years of education: Not on file   Highest education level: Not on file  Occupational History   Not on file  Tobacco Use   Smoking status: Never Smoker   Smokeless tobacco: Never Used  Vaping Use   Vaping Use: Never used  Substance and Sexual Activity   Alcohol use: Not on file   Drug  use: Never   Sexual activity: Not on file  Other Topics Concern   Not on file  Social History Narrative   Not on file   Social Determinants of Health   Financial Resource Strain:    Difficulty of Paying Living Expenses: Not on file  Food Insecurity:    Worried About [place] in the Last Year: Not on file   Ran Out of Food in the Last Year: Not on file  Transportation Needs:    Lack of Transportation (Medical): Not on file   Lack of Transportation (Non-Medical): Not on file  Physical Activity:    Days of Exercise per Week: Not on file   Minutes of Exercise per Session:  Not on file  Stress:    Feeling of Stress : Not on file  Social Connections:    Frequency of Communication with Friends and Family: Not on file   Frequency of Social Gatherings with Friends and Family: Not on file   Attends Religious Services: Not on file   Active Member of Clubs or Organizations: Not on file   Attends Archivist Meetings: Not on file   Marital Status: Not on file     Family History: The patient's family history includes Diabetes in her father; Heart failure in her mother; Stroke in her father and sister.  ROS:   Please see the history of present illness.     All other systems reviewed and are negative.  EKGs/Labs/Other Studies Reviewed:    The following studies were reviewed today:  Echocardiogram 06/09/17: Study Conclusions - Left ventricle: The cavity size was normal. Wall thickness was increased in a pattern of mild LVH. Systolic function was normal. The estimated ejection fraction was in the range of 60% to 65%.Wall motion was normal; there were no regional wall motionabnormalities. The study is not technically sufficient to allow evaluation of LV diastolic function. - Aortic valve: There was trivial regurgitation. - Mitral valve: There was mild regurgitation.  Impressions: - Normal LV function; mild LVH; trace AI; mild MR and TR.   TEE with DCCV: 06/10/17 Successful Aflutter>>NSR Results: Normal LV size and functionwith EF60-65% Normal RV size and function Normal RA Mildly dilatedLAwith spontaneous echo contrast. There is no thrombus in LAA or LA. The LAA emptying velocity is mildly reduced at 35. Normal TVwith mild to moderate TR Normal PVwith mild PR Normal MVwith mild MR Normal trileaflet AVwith mild AR Normal interatrial septum with no evidence of shunt by colorflow dopper  Normal thoracic and ascending aorta.   EKG:  12/02/18 SR rate 76 nonspecific ST changes   Recent Labs: No results found for requested  labs within last 8760 hours.  Recent Lipid Panel    Component Value Date/Time   CHOL 111 06/09/2017 0432   TRIG 88 06/09/2017 0432   HDL 60 06/09/2017 0432   CHOLHDL 1.9 06/09/2017 0432   VLDL 18 06/09/2017 0432   LDLCALC 33 06/09/2017 0432    Physical Exam:    VS:  BP (!) 176/70    Pulse 64    Ht 5' 6.5" (1.689 m)    Wt 145 lb 12.8 oz (66.1 kg)    SpO2 99%    BMI 23.18 kg/m     Wt Readings from Last 3 Encounters:  12/16/19 145 lb 12.8 oz (66.1 kg)  06/08/19 149 lb (67.6 kg)  12/02/18 151 lb (68.5 kg)     Affect appropriate Healthy:  appears stated age HEENT: normal Neck supple with  no adenopathy JVP normal no bruits no thyromegaly Lungs clear with no wheezing and good diaphragmatic motion Heart:  S1/S2 no murmur, no rub, gallop or click PMI normal Abdomen: benighn, BS positve, no tenderness, no AAA no bruit.  No HSM or HJR Distal pulses intact with no bruits No edema Neuro non-focal Skin warm and dry No muscular weakness   ASSESSMENT:    Atrial flutter  PLAN:    In order of problems listed above:  Flutter:  Post TEE/DCC 06/10/17  Initially On low dose eliquis given age and CKD Digoxin d/c last visit Continue beta blocker most recent CR on 12/17/18 1.42 GFR 36   CKD (chronic kidney disease) stage 3  CR 1.46 GFR 26  Stable f/u with Deiderding   Essential hypertension   Pressures well-controlled on current regimen. No medication changes.  Pre-diabetes  A1c 6.1 diet Rx per primary   Pulmonary:  Chronically abnormal CXR/CT last CT done 05/21/18 with slight increase in LUL consolidation She is overdue to see [place] pulmonary and have ordered updated CT  Ordered CT chest abnormal lung exam and previous CT    [employer]

## 2019-12-20 DIAGNOSIS — Z23 Encounter for immunization: Secondary | ICD-10-CM | POA: Diagnosis not present

## 2020-01-03 ENCOUNTER — Other Ambulatory Visit: Payer: Self-pay

## 2020-01-03 ENCOUNTER — Encounter: Payer: Self-pay | Admitting: Pulmonary Disease

## 2020-01-03 ENCOUNTER — Ambulatory Visit (INDEPENDENT_AMBULATORY_CARE_PROVIDER_SITE_OTHER): Payer: Medicare Other | Admitting: Pulmonary Disease

## 2020-01-03 VITALS — BP 136/76 | HR 68 | Temp 97.4°F | Ht 66.5 in | Wt 147.0 lb

## 2020-01-03 DIAGNOSIS — R9389 Abnormal findings on diagnostic imaging of other specified body structures: Secondary | ICD-10-CM | POA: Diagnosis not present

## 2020-01-03 NOTE — Progress Notes (Signed)
Darlene Wang    [id number]    [birth year]  Primary Care Physician:Barnes, Benjamine Mola, MD  Referring Physician: [name], [place],  [place] [address]  Chief complaint:   History of abnormal CT of the chest In for follow-up  HPI:  Patient was last seen in 2019 Last CT scan of the chest was in March of 2020  She has been feeling fine No unusual cough No weight loss No shortness of breath  Covid happened, only making necessary travel, visits.  Denies any significant symptoms at present She followed up with cardiology, noted that she has not had any pulmonary follow-up recently and a CT scan was ordered  CT scan is scheduled to be performed 01/05/2020  History--Patient was hospitalized in March 2019, she was having issues with atrial flutter, required cardioversion She stated that the CT was performed at that time for evaluation of her heart This study at the time did show multiple infiltrative processes, nodules-records did note that she did not have any signs or symptoms of infection at the time She stated she is always felt well Has no respiratory history no history of asthma Possible history of allergies for which she received allergy shots in the past Family history of allergies No family history of cancer Never smoker  Occupation: Nursing Exposures: No mold exposure Smoking history: Never smoker  Outpatient Encounter Medications as of 01/03/2020  Medication Sig  . atorvastatin (LIPITOR) 40 MG tablet Take 40 mg by mouth at bedtime.  . Calcium Carbonate-Vitamin D (CALCIUM 600+D) 600-200 MG-UNIT TABS Take 600 mg by mouth every other day.   . cholecalciferol (VITAMIN D) 1000 units tablet Take 1,000 Units by mouth daily.  . dapagliflozin propanediol (FARXIGA) 5 MG TABS tablet Take 5 mg by mouth daily.  Marland Kitchen ELIQUIS 5 MG TABS tablet TAKE 1 TABLET BY MOUTH  TWICE DAILY  . estrogens, conjugated, (PREMARIN) 0.45 MG tablet Take 0.45 mg by  mouth at bedtime.  Marland Kitchen lisinopril (ZESTRIL) 5 MG tablet Take 5 mg by mouth daily.  . metoprolol tartrate (LOPRESSOR) 25 MG tablet TAKE 1 TABLET BY MOUTH  TWICE DAILY  . Multiple Vitamin (MULTIVITAMIN) capsule Take 1 capsule by mouth daily.   No facility-administered encounter medications on file as of 01/03/2020.    Allergies as of 01/03/2020 - Review Complete 01/03/2020  Allergen Reaction Noted  . Amoxicillin Swelling 01/01/2011  . Other  09/27/2013  . Pneumococcal vac polyvalent  06/08/2019  . Pneumococcal vaccine Swelling 09/27/2013  . Tuberculin ppd Swelling 10/20/2013    Past Medical History:  Diagnosis Date  . CKD (chronic kidney disease)    stage 3   . Diabetes mellitus without complication (HCC)    pre-diabetes   . Hypertension   . Pre-diabetes     Past Surgical History:  Procedure Laterality Date  . CARDIOVERSION N/A 06/10/2017   Procedure: CARDIOVERSION;  Surgeon: Sueanne Margarita, MD;  Location: [facility] ENDOSCOPY;  Service: Cardiovascular;  Laterality: N/A;  . TEE WITHOUT CARDIOVERSION N/A 06/10/2017   Procedure: TRANSESOPHAGEAL ECHOCARDIOGRAM (TEE);  Surgeon: Sueanne Margarita, MD;  Location: [facility] ENDOSCOPY;  Service: Cardiovascular;  Laterality: N/A;    Family History  Problem Relation Age of Onset  . Heart failure Mother   . Stroke Father   . Diabetes Father   . Stroke Sister     Social History   Socioeconomic History  . Marital status: Married    Spouse name: Not on file  .  Number of children: Not on file  . Years of education: Not on file  . Highest education level: Not on file  Occupational History  . Not on file  Tobacco Use  . Smoking status: Never Smoker  . Smokeless tobacco: Never Used  Vaping Use  . Vaping Use: Never used  Substance and Sexual Activity  . Alcohol use: Not on file  . Drug use: Never  . Sexual activity: Not on file  Other Topics Concern  . Not on file  Social History Narrative  . Not on file   Social Determinants of Health    Financial Resource Strain:   . Difficulty of Paying Living Expenses: Not on file  Food Insecurity:   . Worried About Charity fundraiser in the Last Year: Not on file  . Ran Out of Food in the Last Year: Not on file  Transportation Needs:   . Lack of Transportation (Medical): Not on file  . Lack of Transportation (Non-Medical): Not on file  Physical Activity:   . Days of Exercise per Week: Not on file  . Minutes of Exercise per Session: Not on file  Stress:   . Feeling of Stress : Not on file  Social Connections:   . Frequency of Communication with Friends and Family: Not on file  . Frequency of Social Gatherings with Friends and Family: Not on file  . Attends Religious Services: Not on file  . Active Member of Clubs or Organizations: Not on file  . Attends Archivist Meetings: Not on file  . Marital Status: Not on file  Intimate Partner Violence:   . Fear of Current or Ex-Partner: Not on file  . Emotionally Abused: Not on file  . Physically Abused: Not on file  . Sexually Abused: Not on file    Review of systems: Review of Systems -negative for shortness of breath, negative for cough negative for chest pain or discomfort Appetite is maintained Functioning well without significant limitations  Physical Exam:  Vitals:   01/03/20 1047  BP: 136/76  Pulse: 68  Temp: (!) 97.4 F (36.3 C)  SpO2: 99%   Elderly lady, does not appear to be in distress Neck is supple with no JVD, no thyromegaly Clear breath sounds bilaterally S1-S2 appreciated  Data Reviewed: .  CT scan of the chest from 06/09/2017 reviewed by myself showing multiple infiltrative processes, nodular changes  .  CT scan from March 2020 reviewed-persistent infiltrative process-stable in most areas but some area of new infiltration  .  Records from hospitalization recently reviewed  .  CT scan from 2015 was reviewed and compared with most recent   Assessment:  .  Abnormal CT scan of the  chest -Has remained healthy with no significant symptoms -Process likely remnant of infectious process -Scarring in the lungs -We will follow up on CT scan scheduled 01/05/20 time  .  Chronic kidney disease  .  History of atrial fibrillation  .  Emphysema  .  Bronchiectasis Plan/Recommendations: .  Follow-up on CT scan ordered for 01/05/2020  .  Encouraged to call with any significant concerns  .  Encouraged to stay active     Sherrilyn Rist MD [facility] 01/03/2020, 11:02 AM  CC: [name], MD

## 2020-01-03 NOTE — Patient Instructions (Signed)
I will follow up on your CT scan that is ordered for Thursday  Call with significant concerns  Tentative follow-up in 6 months

## 2020-01-04 ENCOUNTER — Encounter: Payer: Self-pay | Admitting: Pulmonary Disease

## 2020-01-05 ENCOUNTER — Ambulatory Visit (HOSPITAL_COMMUNITY)
Admission: RE | Admit: 2020-01-05 | Discharge: 2020-01-05 | Disposition: A | Discharge status: Home / Self Care | Payer: Medicare Other | Source: Ambulatory Visit | Attending: Cardiovascular Disease | Admitting: Cardiovascular Disease

## 2020-01-05 ENCOUNTER — Other Ambulatory Visit: Payer: Self-pay

## 2020-01-05 DIAGNOSIS — J849 Interstitial pulmonary disease, unspecified: Secondary | ICD-10-CM | POA: Insufficient documentation

## 2020-01-05 DIAGNOSIS — K8080 Other cholelithiasis without obstruction: Secondary | ICD-10-CM | POA: Diagnosis not present

## 2020-01-05 DIAGNOSIS — J438 Other emphysema: Secondary | ICD-10-CM | POA: Diagnosis not present

## 2020-01-05 DIAGNOSIS — J984 Other disorders of lung: Secondary | ICD-10-CM | POA: Diagnosis not present

## 2020-01-05 DIAGNOSIS — I700 Atherosclerosis of aorta: Secondary | ICD-10-CM | POA: Diagnosis not present

## 2020-01-09 ENCOUNTER — Telehealth: Payer: Self-pay | Admitting: Cardiovascular Disease

## 2020-01-09 ENCOUNTER — Telehealth: Payer: Self-pay | Admitting: Pulmonary Disease

## 2020-01-09 DIAGNOSIS — R911 Solitary pulmonary nodule: Secondary | ICD-10-CM

## 2020-01-09 DIAGNOSIS — R9389 Abnormal findings on diagnostic imaging of other specified body structures: Secondary | ICD-10-CM

## 2020-01-09 NOTE — Telephone Encounter (Signed)
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

## 2020-01-09 NOTE — Telephone Encounter (Signed)
See result note on CT on 01/09/20

## 2020-01-09 NOTE — Telephone Encounter (Signed)
Order for PET has been placed. Nothing further needed. 

## 2020-01-09 NOTE — Telephone Encounter (Signed)
Order PET scan  Schedule to come to the office soon after PET is performed  -I did let her know that she will have an appointment set up for [place] to review the PET scan once it is done

## 2020-01-09 NOTE — Telephone Encounter (Signed)
Patient is returning call to discuss CT results. If she is not available please leave a detailed voice message.

## 2020-01-11 DIAGNOSIS — Z23 Encounter for immunization: Secondary | ICD-10-CM | POA: Diagnosis not present

## 2020-01-12 DIAGNOSIS — N2581 Secondary hyperparathyroidism of renal origin: Secondary | ICD-10-CM | POA: Diagnosis not present

## 2020-01-12 DIAGNOSIS — I10 Essential (primary) hypertension: Secondary | ICD-10-CM | POA: Diagnosis not present

## 2020-01-12 DIAGNOSIS — R9389 Abnormal findings on diagnostic imaging of other specified body structures: Secondary | ICD-10-CM | POA: Diagnosis not present

## 2020-01-12 DIAGNOSIS — E1122 Type 2 diabetes mellitus with diabetic chronic kidney disease: Secondary | ICD-10-CM | POA: Diagnosis not present

## 2020-01-12 DIAGNOSIS — D631 Anemia in chronic kidney disease: Secondary | ICD-10-CM | POA: Diagnosis not present

## 2020-01-12 DIAGNOSIS — E7800 Pure hypercholesterolemia, unspecified: Secondary | ICD-10-CM | POA: Diagnosis not present

## 2020-01-12 DIAGNOSIS — N189 Chronic kidney disease, unspecified: Secondary | ICD-10-CM | POA: Diagnosis not present

## 2020-01-20 ENCOUNTER — Other Ambulatory Visit: Payer: Self-pay

## 2020-01-20 ENCOUNTER — Ambulatory Visit (HOSPITAL_COMMUNITY)
Admission: RE | Admit: 2020-01-20 | Discharge: 2020-01-20 | Disposition: A | Discharge status: Home / Self Care | Payer: Medicare Other | Source: Ambulatory Visit | Attending: Pulmonary Disease | Admitting: Pulmonary Disease

## 2020-01-20 DIAGNOSIS — R911 Solitary pulmonary nodule: Secondary | ICD-10-CM | POA: Insufficient documentation

## 2020-01-20 DIAGNOSIS — J984 Other disorders of lung: Secondary | ICD-10-CM | POA: Diagnosis not present

## 2020-01-20 DIAGNOSIS — I700 Atherosclerosis of aorta: Secondary | ICD-10-CM | POA: Insufficient documentation

## 2020-01-20 DIAGNOSIS — M4186 Other forms of scoliosis, lumbar region: Secondary | ICD-10-CM | POA: Diagnosis not present

## 2020-01-20 DIAGNOSIS — R9389 Abnormal findings on diagnostic imaging of other specified body structures: Secondary | ICD-10-CM | POA: Diagnosis not present

## 2020-01-20 DIAGNOSIS — K8020 Calculus of gallbladder without cholecystitis without obstruction: Secondary | ICD-10-CM | POA: Insufficient documentation

## 2020-01-20 DIAGNOSIS — I6522 Occlusion and stenosis of left carotid artery: Secondary | ICD-10-CM | POA: Diagnosis not present

## 2020-01-20 LAB — GLUCOSE, CAPILLARY: Glucose-Capillary: 88 mg/dL (ref 70–99)

## 2020-01-20 MED ORDER — FLUDEOXYGLUCOSE F - 18 (FDG) INJECTION
7.3000 | Freq: Once | INTRAVENOUS | Status: AC
  Administered 2020-01-20: 7.3 via INTRAVENOUS

## 2020-01-24 ENCOUNTER — Telehealth: Payer: Self-pay | Admitting: Pulmonary Disease

## 2020-01-24 DIAGNOSIS — K862 Cyst of pancreas: Secondary | ICD-10-CM

## 2020-01-24 DIAGNOSIS — R911 Solitary pulmonary nodule: Secondary | ICD-10-CM

## 2020-01-24 DIAGNOSIS — K8689 Other specified diseases of pancreas: Secondary | ICD-10-CM

## 2020-01-24 NOTE — Addendum Note (Signed)
Addended by: Claudette Head A on: 01/24/2020 02:37 PM   Modules accepted: Orders

## 2020-01-24 NOTE — Telephone Encounter (Signed)
Dr. Ander Slade, will you please verify what dx you would like to use for MRI?

## 2020-01-24 NOTE — Telephone Encounter (Signed)
Call patient  Set up for 2 procedures  1. MRI  Pancreatic protocol MRI with and without contrast.  2. Interventional radiology  CT-guided biopsy of left upper lobe anteromedial lesion Dr. Vernard Gambles did review the CT and PET  She is on Eliquis which she needs to hold for the procedure   Let me know if we have any difficulty in setting this up

## 2020-01-24 NOTE — Telephone Encounter (Signed)
Called and discussed PET scan findings with patient  PET scan concerning for neoplastic process  Patient will be scheduled for CT-guided biopsy of left upper lobe lesion Will order MRI to further evaluate the pancreas

## 2020-01-26 ENCOUNTER — Telehealth: Payer: Self-pay | Admitting: Cardiovascular Disease

## 2020-01-26 NOTE — Telephone Encounter (Signed)
Patient states she is going to have a biopsy and has questions about holding any medications. I informed the patient we will need the office performing the procedure to call or fax a clearance request to the office. Patient states she wanted to speak with the nurse anyway. She states Dr. Johnsie Cancel already knows about the procedure.

## 2020-01-26 NOTE — Telephone Encounter (Signed)
I s/w pt in regards to upcoming procedure for Bx. Pt states she will be having a Bx with [facility] with Dr. Sherrilyn Rist. Pt states she was informed that she will need to hold her Eliquis x 5 days prior to Bx. Pt states Dr. Johnsie Cancel is aware of this and wants to be sure that he is on board with holding Eliquis x 5 days as she was informed by Dr. Ander Slade. I informed the pt that I will reach out to [facility] in regards to Bx procedure, which then our Pre Op Team will assess for clearance. Once cleared we will send clearance over to Dr. Ander Slade as well as inform her (the pt) when she has been cleared. Pt thanked me for the help and the call back.   I tried to reach out to [facility] in regards to Bx procedure. I called the office x 2 though could not get anyone to answer the line. Our office will await for clearance request to come in.

## 2020-01-30 NOTE — Telephone Encounter (Signed)
The message I sent on 11/9 was for the office to set this up and let me know if there is any difficulty in setting it up  I stated that I spoke with the patient Also spoke with the radiologist who said it was amendable to CT-guided biopsy  Is this not set up to the office ?  MRI is for pancreatic mass-further evaluation   Please let me know if there is any other ongoing issues with this

## 2020-01-30 NOTE — Telephone Encounter (Signed)
Reichl, [place]  Alease Frame 1 hour ago (9:55 AM)   pt is calling wanting a call back regarding procedure she is having . she was supposed to get a call back . please advsie    Incoming call    ATC unable to reach   Dr. Ander Slade have you ordered the biopsy yet? Please Advise

## 2020-01-30 NOTE — Telephone Encounter (Signed)
See note on 01/24/20 for pulmonary office.

## 2020-01-31 NOTE — Addendum Note (Signed)
Addended by: Claudette Head A on: 01/31/2020 04:55 PM   Modules accepted: Orders

## 2020-01-31 NOTE — Telephone Encounter (Signed)
Received dx from [facility] verbally.  CT bx and MRI has been ordered. Nothing further needed.

## 2020-01-31 NOTE — Addendum Note (Signed)
Addended by: Claudette Head A on: 01/31/2020 05:05 PM   Modules accepted: Orders

## 2020-02-01 ENCOUNTER — Encounter (HOSPITAL_COMMUNITY): Payer: Self-pay | Admitting: Radiology

## 2020-02-01 NOTE — Progress Notes (Signed)
Darlene Wang Female, 85 y.o., 12/06/1934 MRN:  [id number] Phone:  [phone] (H) PCP:  [name], MD Primary Cvg:  Medicare/Medicare Part A And B Next Appt With Radiology ([facility]-CT 3) 02/14/2020 at 11:00 AM  RE: CT Biopsy Received: Romona Curls, MD  Garth Bigness D Yes that is fine       Previous Messages   ----- Message -----  From: Garth Bigness D  Sent: 01/31/2020  6:29 PM EST  To: Josue Hector, MD  Subject: FW: CT Biopsy                   Good evening Dr Johnsie Cancel, patient has order in for a CT Biopsy and I noticed that patient is on Eliquis and will need to hold for 2 days prior. Need to know if it is okay for patient to hold prior to biopsy.  ----- Message -----  From: Garth Bigness D  Sent: 01/31/2020  6:26 PM EST  To: Ir Procedure Requests  Subject: CT Biopsy                     Procedure: CT Biopsy   Reason: Left upper lobe pulmonary nodule   History: CT, NM PET in computer   Provider: Sherrilyn Rist A   Provider Contact: [phone]

## 2020-02-01 NOTE — Progress Notes (Signed)
Darlene Wang Female, 85 y.o., [birth year] MRN:  [id number] Phone:  [phone] (H) PCP:  [name], MD Primary Cvg:  Medicare/Medicare Part A And B Next Appt With Radiology ([facility]-CT 3) 02/14/2020 at 11:00 AM  RE: CT Biopsy Received: Today Arne Cleveland, MD  Jillyn Hidden [place]   CT core LUL PET+ mass  L parasternal approach attn aorta   DDH       Previous Messages   ----- Message -----  From: Garth Bigness D  Sent: 01/31/2020  6:26 PM EST  To: Ir Procedure Requests  Subject: CT Biopsy                     Procedure: CT Biopsy   Reason: Left upper lobe pulmonary nodule   History: CT, NM PET in computer   Provider: Sherrilyn Rist A   Provider Contact: [phone]

## 2020-02-08 ENCOUNTER — Ambulatory Visit (HOSPITAL_COMMUNITY): Payer: Medicare Other

## 2020-02-11 ENCOUNTER — Ambulatory Visit (HOSPITAL_COMMUNITY): Payer: Medicare Other

## 2020-02-13 ENCOUNTER — Ambulatory Visit (HOSPITAL_COMMUNITY): Payer: Medicare Other

## 2020-02-13 ENCOUNTER — Other Ambulatory Visit (HOSPITAL_COMMUNITY)
Admission: RE | Admit: 2020-02-13 | Discharge: 2020-02-13 | Disposition: A | Discharge status: Home / Self Care | Payer: Medicare Other | Source: Ambulatory Visit | Attending: Pulmonary Disease | Admitting: Pulmonary Disease

## 2020-02-13 DIAGNOSIS — Z20822 Contact with and (suspected) exposure to covid-19: Secondary | ICD-10-CM | POA: Diagnosis not present

## 2020-02-13 DIAGNOSIS — Z01812 Encounter for preprocedural laboratory examination: Secondary | ICD-10-CM | POA: Insufficient documentation

## 2020-02-14 ENCOUNTER — Ambulatory Visit (HOSPITAL_COMMUNITY)
Admission: RE | Admit: 2020-02-14 | Discharge: 2020-02-14 | Disposition: A | Discharge status: Home / Self Care | Payer: Medicare Other | Source: Ambulatory Visit | Attending: Pulmonary Disease | Admitting: Pulmonary Disease

## 2020-02-14 ENCOUNTER — Ambulatory Visit (HOSPITAL_COMMUNITY): Payer: Medicare Other

## 2020-02-14 ENCOUNTER — Telehealth: Payer: Self-pay | Admitting: Pulmonary Disease

## 2020-02-14 ENCOUNTER — Other Ambulatory Visit: Payer: Self-pay | Admitting: Pulmonary Disease

## 2020-02-14 ENCOUNTER — Other Ambulatory Visit: Payer: Self-pay

## 2020-02-14 DIAGNOSIS — K838 Other specified diseases of biliary tract: Secondary | ICD-10-CM | POA: Diagnosis not present

## 2020-02-14 DIAGNOSIS — K862 Cyst of pancreas: Secondary | ICD-10-CM

## 2020-02-14 DIAGNOSIS — K8689 Other specified diseases of pancreas: Secondary | ICD-10-CM | POA: Insufficient documentation

## 2020-02-14 LAB — SARS CORONAVIRUS 2 (TAT 6-24 HRS): SARS Coronavirus 2: NEGATIVE

## 2020-02-14 MED ORDER — GADOBUTROL 1 MMOL/ML IV SOLN
6.0000 mL | Freq: Once | INTRAVENOUS | Status: AC | PRN
  Administered 2020-02-14: 7.5 mL via INTRAVENOUS

## 2020-02-14 NOTE — Telephone Encounter (Signed)
Kindly schedule referral to GI for evaluation of pancreatic mass  Found on CT chest MRI was inconclusive-recommended further CT scanning  Patient will be best served by GI evaluation   I did call the patient to let her know about the MRI and this referral  Patient requests to see Dr. Theda Sers GI physician saw the patient in the past  She requests to be called to let her know that the appointment is scheduled       Please let me know if there is any problem in scheduling this

## 2020-02-14 NOTE — Telephone Encounter (Signed)
GI referral placed

## 2020-02-15 ENCOUNTER — Other Ambulatory Visit (HOSPITAL_COMMUNITY): Payer: Self-pay | Admitting: Physician Assistant

## 2020-02-15 ENCOUNTER — Telehealth: Payer: Self-pay | Admitting: Pulmonary Disease

## 2020-02-15 NOTE — Telephone Encounter (Signed)
Called and spoke with the pt and she stated that the referral has already been placed on 11/30 and she was called and told that Dr. Osborn Coho office would be contacting her for her appt. Nothing further is needed.

## 2020-02-16 ENCOUNTER — Ambulatory Visit (HOSPITAL_COMMUNITY)
Admission: RE | Admit: 2020-02-16 | Discharge: 2020-02-16 | Disposition: A | Discharge status: Home / Self Care | Payer: Medicare Other | Source: Ambulatory Visit | Attending: Pulmonary Disease | Admitting: Pulmonary Disease

## 2020-02-16 ENCOUNTER — Ambulatory Visit (HOSPITAL_COMMUNITY)
Admission: RE | Admit: 2020-02-16 | Discharge: 2020-02-16 | Disposition: A | Discharge status: Home / Self Care | Payer: Medicare Other | Source: Ambulatory Visit | Attending: Interventional Radiology | Admitting: Interventional Radiology

## 2020-02-16 ENCOUNTER — Other Ambulatory Visit: Payer: Self-pay

## 2020-02-16 ENCOUNTER — Encounter (HOSPITAL_COMMUNITY): Payer: Self-pay

## 2020-02-16 ENCOUNTER — Ambulatory Visit (HOSPITAL_COMMUNITY): Payer: Medicare Other

## 2020-02-16 DIAGNOSIS — R911 Solitary pulmonary nodule: Secondary | ICD-10-CM | POA: Insufficient documentation

## 2020-02-16 DIAGNOSIS — E1122 Type 2 diabetes mellitus with diabetic chronic kidney disease: Secondary | ICD-10-CM | POA: Insufficient documentation

## 2020-02-16 DIAGNOSIS — J8410 Pulmonary fibrosis, unspecified: Secondary | ICD-10-CM | POA: Diagnosis not present

## 2020-02-16 DIAGNOSIS — N183 Chronic kidney disease, stage 3 unspecified: Secondary | ICD-10-CM | POA: Diagnosis not present

## 2020-02-16 DIAGNOSIS — Z7984 Long term (current) use of oral hypoglycemic drugs: Secondary | ICD-10-CM | POA: Diagnosis not present

## 2020-02-16 DIAGNOSIS — I129 Hypertensive chronic kidney disease with stage 1 through stage 4 chronic kidney disease, or unspecified chronic kidney disease: Secondary | ICD-10-CM | POA: Insufficient documentation

## 2020-02-16 DIAGNOSIS — Z7901 Long term (current) use of anticoagulants: Secondary | ICD-10-CM | POA: Diagnosis not present

## 2020-02-16 DIAGNOSIS — J189 Pneumonia, unspecified organism: Secondary | ICD-10-CM | POA: Diagnosis not present

## 2020-02-16 DIAGNOSIS — R918 Other nonspecific abnormal finding of lung field: Secondary | ICD-10-CM | POA: Diagnosis not present

## 2020-02-16 DIAGNOSIS — Z79899 Other long term (current) drug therapy: Secondary | ICD-10-CM | POA: Insufficient documentation

## 2020-02-16 DIAGNOSIS — Z9889 Other specified postprocedural states: Secondary | ICD-10-CM

## 2020-02-16 LAB — CBC
HCT: 38.9 % (ref 36.0–46.0)
Hemoglobin: 12.2 g/dL (ref 12.0–15.0)
MCH: 29.4 pg (ref 26.0–34.0)
MCHC: 31.4 g/dL (ref 30.0–36.0)
MCV: 93.7 fL (ref 80.0–100.0)
Platelets: 194 10*3/uL (ref 150–400)
RBC: 4.15 MIL/uL (ref 3.87–5.11)
RDW: 14.2 % (ref 11.5–15.5)
WBC: 5.6 10*3/uL (ref 4.0–10.5)
nRBC: 0 % (ref 0.0–0.2)

## 2020-02-16 LAB — PROTIME-INR
INR: 1 (ref 0.8–1.2)
Prothrombin Time: 13 seconds (ref 11.4–15.2)

## 2020-02-16 MED ORDER — FENTANYL CITRATE (PF) 100 MCG/2ML IJ SOLN
INTRAMUSCULAR | Status: AC | PRN
Start: 2020-02-16 — End: 2020-02-16
  Administered 2020-02-16: 25 ug via INTRAVENOUS

## 2020-02-16 MED ORDER — SODIUM CHLORIDE 0.9 % IV SOLN: INTRAVENOUS | Status: DC

## 2020-02-16 MED ORDER — MIDAZOLAM HCL 2 MG/2ML IJ SOLN
INTRAMUSCULAR | Status: AC
  Filled 2020-02-16: qty 2

## 2020-02-16 MED ORDER — MIDAZOLAM HCL 2 MG/2ML IJ SOLN
INTRAMUSCULAR | Status: AC | PRN
  Administered 2020-02-16: 1 mg via INTRAVENOUS
  Administered 2020-02-16: 0.5 mg via INTRAVENOUS

## 2020-02-16 MED ORDER — LIDOCAINE HCL 1 % IJ SOLN
INTRAMUSCULAR | Status: AC
  Filled 2020-02-16: qty 20

## 2020-02-16 MED ORDER — FENTANYL CITRATE (PF) 100 MCG/2ML IJ SOLN
INTRAMUSCULAR | Status: AC
  Filled 2020-02-16: qty 2

## 2020-02-16 NOTE — Discharge Instructions (Addendum)
Restart ELIQUIS in 24 hours   Lung Biopsy, Care After This sheet gives you information about how to care for yourself after your procedure. Your health care provider may also give you more specific instructions depending on the type of biopsy you had. If you have problems or questions, contact your health care provider. What can I expect after the procedure? After the procedure, it is common to have:  A cough.  A sore throat.  Pain where a needle, bronchoscope, or incision was used to collect a biopsy sample (biopsy site). Follow these instructions at home: Medicines  Take over-the-counter and prescription medicines only as told by your health care provider.  Do not drink alcohol if your health care provider tells you not to drink.  Ask your health care provider if the medicine prescribed to you: ? Requires you to avoid driving or using heavy machinery. ? Can cause constipation. You may need to take these actions to prevent or treat constipation:  Drink enough fluid to keep your urine pale yellow.  Take over-the-counter or prescription medicines.  Eat foods that are high in fiber, such as beans, whole grains, and fresh fruits and vegetables.  Limit foods that are high in fat and processed sugars, such as fried or sweet foods.  Do not drive for 24 hours if you were given a sedative. Biopsy site care   Follow instructions from your health care provider about how to take care of your biopsy site. Make sure you: ? Wash your hands with soap and water before and after you change your bandage (dressing). If soap and water are not available, use hand sanitizer. ? Change your dressing as told by your health care provider. ? Leave stitches (sutures), skin glue, or adhesive strips in place. These skin closures may need to stay in place for 2 weeks or longer. If adhesive strip edges start to loosen and curl up, you may trim the loose edges. Do not remove adhesive strips completely unless  your health care provider tells you to do that.  Do not take baths, swim, or use a hot tub until your health care provider approves. Ask your health care provider if you may take showers. You may only be allowed to take sponge baths.  Check your biopsy site every day for signs of infection. Check for: ? Redness, swelling, or more pain. ? Fluid or blood. ? Warmth. ? Pus or a bad smell. General instructions  Return to your normal activities as told by your health care provider. Ask your health care provider what activities are safe for you.  It is up to you to get the results of your procedure. Ask your health care provider, or the department that is doing the procedure, when your results will be ready.  Keep all follow-up visits as told by your health care provider. This is important. Contact a health care provider if:  You have a fever.  You have redness, swelling, or more pain around your biopsy site.  You have fluid or blood coming from your biopsy site.  Your biopsy site feels warm to the touch.  You have pus or a bad smell coming from your biopsy site.  You have pain that does not get better with medicine. Get help right away if:  You cough up blood.  You have trouble breathing.  You have chest pain.  You lose consciousness. Summary  After the procedure, it is common to have a sore throat and a cough.  Return to  your normal activities as told by your health care provider. Ask your health care provider what activities are safe for you.  Take over-the-counter and prescription medicines only as told by your health care provider.  Report any unusual symptoms to your health care provider. This information is not intended to replace advice given to you by your health care provider. Make sure you discuss any questions you have with your health care provider. Document Revised: 04/07/2018 Document Reviewed: 04/01/2016 Elsevier Patient Education  [place]. Moderate Conscious Sedation, Adult Sedation is the use of medicines to promote relaxation and relieve discomfort and anxiety. Moderate conscious sedation is a type of sedation. Under moderate conscious sedation, you are less alert than normal, but you are still able to respond to instructions, touch, or both. Moderate conscious sedation is used during short medical and dental procedures. It is milder than deep sedation, which is a type of sedation under which you cannot be easily woken up. It is also milder than general anesthesia, which is the use of medicines to make you unconscious. Moderate conscious sedation allows you to return to your regular activities sooner. Tell a health care provider about:  Any allergies you have.  All medicines you are taking, including vitamins, herbs, eye drops, creams, and over-the-counter medicines.  Use of steroids (by mouth or creams).  Any problems you or family members have had with sedatives and anesthetic medicines.  Any blood disorders you have.  Any surgeries you have had.  Any medical conditions you have, such as sleep apnea.  Whether you are pregnant or may be pregnant.  Any use of cigarettes, alcohol, marijuana, or street drugs. What are the risks? Generally, this is a safe procedure. However, problems may occur, including:  Getting too much medicine (oversedation).  Nausea.  Allergic reaction to medicines.  Trouble breathing. If this happens, a breathing tube may be used to help with breathing. It will be removed when you are awake and breathing on your own.  Heart trouble.  Lung trouble. What happens before the procedure? Staying hydrated Follow instructions from your health care provider about hydration, which may include:  Up to 2 hours before the procedure - you may continue to drink clear liquids, such as water, clear fruit juice, black coffee, and plain tea. Eating and drinking restrictions Follow instructions from  your health care provider about eating and drinking, which may include:  8 hours before the procedure - stop eating heavy meals or foods such as meat, fried foods, or fatty foods.  6 hours before the procedure - stop eating light meals or foods, such as toast or cereal.  6 hours before the procedure - stop drinking milk or drinks that contain milk.  2 hours before the procedure - stop drinking clear liquids. Medicine Ask your health care provider about:  Changing or stopping your regular medicines. This is especially important if you are taking diabetes medicines or blood thinners.  Taking medicines such as aspirin and ibuprofen. These medicines can thin your blood. Do not take these medicines before your procedure if your health care provider instructs you not to.  Tests and exams  You will have a physical exam.  You may have blood tests done to show: ? How well your kidneys and liver are working. ? How well your blood can clot. General instructions  Plan to have someone take you home from the hospital or clinic.  If you will be going home right after the procedure, plan to  have someone with you for 24 hours. What happens during the procedure?  An IV tube will be inserted into one of your veins.  Medicine to help you relax (sedative) will be given through the IV tube.  The medical or dental procedure will be performed. What happens after the procedure?  Your blood pressure, heart rate, breathing rate, and blood oxygen level will be monitored often until the medicines you were given have worn off.  Do not drive for 24 hours. This information is not intended to replace advice given to you by your health care provider. Make sure you discuss any questions you have with your health care provider. Document Revised: 02/13/2017 Document Reviewed: 06/23/2015 Elsevier Patient Education  2020 [employer].

## 2020-02-16 NOTE — H&P (Signed)
Chief Complaint: Patient was seen in consultation today for lung mass  Referring Physician(s): [name],Adewale A  Supervising Physician: Sandi Mariscal  Patient Status: [facility] - Out-pt  History of Present Illness: Darlene Wang is a 85 y.o. female DM, HTN, CKD stage III followed by pulmonology for abnormal CT imaging and history of infilltrative processes, nodules.  She was last seen by Pulmonology in October 2019 who felt process likely remnan of infection process, however repeat CT Chest 01/05/20 showed: 1. Enlarging medial left upper lobe consolidative mass and new soft tissue density extending inferiorly along the medial aspect of the left upper lobe. Findings worrisome for slow growing adenocarcinoma. Recommend referral to multi-disciplinary thoracic Oncology clinic [facility]) and PET-CT for further evaluation. 2. Stable biapical pleural and parenchymal scarring changes. 3. New bibasilar opacities, likely inflammatory or infectious change. 4. New 11.5 mm nodular density at the right lung base. 5. Other pulmonary nodules are stable. 6. No mediastinal or hilar mass or adenopathy. 7. No findings for upper abdominal metastatic disease. 8. Cholelithiasis. 9. Emphysema and aortic atherosclerosis.  Subsequent PET 01/20/20 showed: 1. The peripheral consolidative masses in the lungs are hypermetabolic, especially the slow growing medial left upper lobe lesion which has a maximum SUV of 10.3. Appearance concerning for multifocal adenocarcinoma. 2. Nodularity along the inferior margin of the tip of the tail the pancreas is hypermetabolic, and metastatic lesion or primary pancreatic malignancy not excluded. Pancreatic protocol MRI with and without contrast is recommended with attention to the inferior margin of the tip of the tail the pancreas. 3. Other imaging findings of potential clinical significance: Aortic Atherosclerosis (ICD10-I70.0). Cholelithiasis. Dextroconvex lumbar scoliosis with  rotary component.  IR consulted for lung nodule biopsy at the request of Dr. Ander Slade.  Patient case reviewed and approved by Dr. Vernard Gambles.   Darlene Wang presents today in her usual state of health.  She has been NPO.  She states she has no functional limitations at home and has continued with her activities of daily living as well as her regular hobbies.  She plays the clarinet and sings daily.  Denies shortness of breath, cough, fever, chills, nausea, abdominal pain.   Past Medical History:  Diagnosis Date  . CKD (chronic kidney disease)    stage 3   . Diabetes mellitus without complication (HCC)    pre-diabetes   . Hypertension   . Pre-diabetes     Past Surgical History:  Procedure Laterality Date  . CARDIOVERSION N/A 06/10/2017   Procedure: CARDIOVERSION;  Surgeon: Sueanne Margarita, MD;  Location: [facility] ENDOSCOPY;  Service: Cardiovascular;  Laterality: N/A;  . TEE WITHOUT CARDIOVERSION N/A 06/10/2017   Procedure: TRANSESOPHAGEAL ECHOCARDIOGRAM (TEE);  Surgeon: Sueanne Margarita, MD;  Location: [facility] ENDOSCOPY;  Service: Cardiovascular;  Laterality: N/A;    Allergies: Amoxicillin, Pneumococcal vac polyvalent, Pneumococcal vaccine, and Tuberculin ppd  Medications: Prior to Admission medications   Medication Sig Start Date End Date Taking? Authorizing Provider  acetaminophen (TYLENOL) 650 MG CR tablet Take 650 mg by mouth every 8 (eight) hours as needed for pain.   Yes [provider]  atorvastatin (LIPITOR) 40 MG tablet Take 40 mg by mouth at bedtime.   Yes [provider]  Calcium Carbonate-Vitamin D (CALCIUM 600+D) 600-200 MG-UNIT TABS Take 600 mg by mouth every other day.    Yes [provider]  cholecalciferol (VITAMIN D) 1000 units tablet Take 1,000 Units by mouth daily.   Yes [provider]  dapagliflozin propanediol (FARXIGA) 5 MG TABS tablet Take  5 mg by mouth daily. 03/31/19  Yes [provider]  ELIQUIS 5 MG TABS tablet TAKE 1 TABLET BY  MOUTH  TWICE DAILY Patient taking differently: Take 5 mg by mouth 2 (two) times daily.  11/10/19  Yes Josue Hector, MD  estrogens, conjugated, (PREMARIN) 0.45 MG tablet Take 0.45 mg by mouth at bedtime.   Yes [provider]  lisinopril (ZESTRIL) 5 MG tablet Take 5 mg by mouth at bedtime.  12/14/19  Yes [provider]  metoprolol tartrate (LOPRESSOR) 25 MG tablet TAKE 1 TABLET BY MOUTH  TWICE DAILY Patient taking differently: Take 25 mg by mouth 2 (two) times daily.  11/10/19  Yes Josue Hector, MD  Multiple Vitamin (MULTIVITAMIN) capsule Take 1 capsule by mouth daily. Centrum   Yes [provider]     Family History  Problem Relation Age of Onset  . Heart failure Mother   . Stroke Father   . Diabetes Father   . Stroke Sister     Social History   Socioeconomic History  . Marital status: Married    Spouse name: Not on file  . Number of children: Not on file  . Years of education: Not on file  . Highest education level: Not on file  Occupational History  . Not on file  Tobacco Use  . Smoking status: Never Smoker  . Smokeless tobacco: Never Used  Vaping Use  . Vaping Use: Never used  Substance and Sexual Activity  . Alcohol use: Not on file  . Drug use: Never  . Sexual activity: Not on file  Other Topics Concern  . Not on file  Social History Narrative  . Not on file   Social Determinants of Health   Financial Resource Strain:   . Difficulty of Paying Living Expenses: Not on file  Food Insecurity:   . Worried About Charity fundraiser in the Last Year: Not on file  . Ran Out of Food in the Last Year: Not on file  Transportation Needs:   . Lack of Transportation (Medical): Not on file  . Lack of Transportation (Non-Medical): Not on file  Physical Activity:   . Days of Exercise per Week: Not on file  . Minutes of Exercise per Session: Not on file  Stress:   . Feeling of Stress : Not on file  Social Connections:   . Frequency of  Communication with Friends and Family: Not on file  . Frequency of Social Gatherings with Friends and Family: Not on file  . Attends Religious Services: Not on file  . Active Member of Clubs or Organizations: Not on file  . Attends Archivist Meetings: Not on file  . Marital Status: Not on file     Review of Systems: A 12 point ROS discussed and pertinent positives are indicated in the HPI above.  All other systems are negative.  Review of Systems  Constitutional: Negative for fatigue and fever.  Respiratory: Negative for cough and shortness of breath.   Cardiovascular: Negative for chest pain.  Gastrointestinal: Negative for abdominal pain, nausea and vomiting.  Musculoskeletal: Negative for back pain.  Psychiatric/Behavioral: Negative for behavioral problems and confusion.    Vital Signs: BP (!) 161/66   Pulse (!) 58   Temp (!) 97.5 F (36.4 C) (Oral)   Ht 5' 6.5" (1.689 m)   Wt 147 lb (66.7 kg)   SpO2 100%   BMI 23.37 kg/m   Physical Exam Vitals and nursing note  reviewed.  Constitutional:      General: She is not in acute distress.    Appearance: She is normal weight. She is not ill-appearing.  HENT:     Mouth/Throat:     Mouth: Mucous membranes are moist.     Pharynx: Oropharynx is clear.  Cardiovascular:     Rate and Rhythm: Normal rate and regular rhythm.  Pulmonary:     Effort: Pulmonary effort is normal. No respiratory distress.     Breath sounds: Normal breath sounds.  Skin:    General: Skin is warm and dry.  Neurological:     General: No focal deficit present.     Mental Status: She is alert and oriented to person, place, and time. Mental status is at baseline.  Psychiatric:        Mood and Affect: Mood normal.        Behavior: Behavior normal.        Thought Content: Thought content normal.        Judgment: Judgment normal.      MD Evaluation Airway: WNL Heart: WNL Abdomen: WNL Chest/ Lungs: WNL ASA  Classification:  3 Mallampati/Airway Score: One   Imaging: MR 3D Recon At Scanner  Result Date: 02/14/2020 CLINICAL DATA:  Question pancreatic lesion on recent PET evaluation. EXAM: MRI ABDOMEN WITHOUT AND WITH CONTRAST (INCLUDING MRCP) TECHNIQUE: Multiplanar multisequence MR imaging of the abdomen was performed both before and after the administration of intravenous contrast. Heavily T2-weighted images of the biliary and pancreatic ducts were obtained, and three-dimensional MRCP images were rendered by post processing. CONTRAST:  7.48mL GADAVIST GADOBUTROL 1 MMOL/ML IV SOLN COMPARISON:  CT of the chest from October 2021 and PET exam of January 20, 2020 FINDINGS: Lower chest: Areas of consolidative changes at the lung bases felt to be suspicious for multifocal adenocarcinoma are not well evaluated on the MRI assessment. Hepatobiliary: Atrophy of the LEFT hepatic lobe with isolated biliary duct dilation in the LEFT hepatic lobe and attenuation of the LEFT hepatic duct on MRCP images such that it is not visible near the hepatic hilum or not clearly visible. Vague area of relative hypoenhancement seen on late arterial phase measuring 1.6 x 1.0 cm posterior to the LEFT portal vein. LEFT portal vein remains patent as do main and RIGHT portal venous structures. Area above becomes less conspicuous on later phases, it is unclear whether this represents volume averaging from subjacent fat or an intrahepatic lesion study mildly limited secondary to respiratory motion no intrahepatic ductal dilation in the RIGHT hepatic lobe or extrahepatic biliary duct dilation or pericholecystic inflammation. Pancreas: Area of that showed increased metabolic activity on the PET exam is hypointense relative to the pancreas on T1 weighted images. This does not display cystic features and enhances to a similar degree when compared to the adjacent pancreatic tissue. This appears to track into the phrenic 0 colic ligament/mesocolon and measures  approximately 1.9 x 1.6 cm greatest dimension, assessment mildly limited by motion artifact Spleen:  Grossly normal spleen. Adrenals/Urinary Tract: Adrenal glands are normal. Symmetric renal enhancement. No hydronephrosis. Stomach/Bowel: No acute gastrointestinal process. Infiltration of the mesocolon with tendrils of enhancement that extend from the mass in the pancreatic tail at the level of the splenic flexure. Vascular/Lymphatic: Vascular structures in the abdomen are patent. There is no gastrohepatic or hepatoduodenal ligament lymphadenopathy. No retroperitoneal or mesenteric lymphadenopathy. Other:  Trace perihepatic ascites Musculoskeletal: No suspicious bone lesions identified. Spinal degenerative changes with dextroconvex scoliotic curvature in the lumbar spine. IMPRESSION:  1. Area in the tail of the pancreas may infiltrate the transverse mesocolon and is suspicious for pancreatic neoplasm. Due to respiratory motion on today's study CT may be helpful to further evaluate this and the potential for central obstructing mass adjacent the LEFT porta hepatis in the setting of segmental/lobar isolated biliary duct distension. 2. Area of hypoenhancement versus volume-averaging but near the site of biliary duct transition raising the question of central obstructing lesion. Given respiratory variation and problems with volume averaging on the current study CT may be helpful, utilizing thin section pancreatic protocol, encompassing the entire liver and pancreas to further assess both of these abnormalities. Endoscopic assessment and or ERCP could also be considered as warranted based on clinical symptoms as a more aggressive approach. Electronically Signed   By: Zetta Bills M.D.   On: 02/14/2020 11:42   NM PET Image Initial (PI) Skull Base To Thigh  Result Date: 01/20/2020 CLINICAL DATA:  Initial treatment strategy for enlarging pulmonary nodules. EXAM: NUCLEAR MEDICINE PET SKULL BASE TO THIGH TECHNIQUE: 7.3  mCi F-18 FDG was injected intravenously. Full-ring PET imaging was performed from the skull base to thigh after the radiotracer. CT data was obtained and used for attenuation correction and anatomic localization. Fasting blood glucose: 88 mg/dl COMPARISON:  CT chest 01/05/2020 FINDINGS: Mediastinal blood pool activity: SUV max 3.3 Liver activity: SUV max NA NECK: Symmetric accentuated glottic activity, maximum SUV 11.3, probably physiologic. No hypermetabolic adenopathy in the neck. Incidental CT findings: Left common carotid atherosclerotic calcification. CHEST: The slow growing medial left upper lobe consolidative mass is hypermetabolic with maximum SUV 10.3, suspicious for malignancy. The other scattered consolidative primarily subpleural lesions are likewise hypermetabolic although mostly less so. For example, the posterior basal cell a segment left lower lobe lesion measuring 3.7 by 1.7 cm has a maximum SUV of 7.3 and the right lower lobe medial lesion measuring 3.2 by 1.8 cm has a maximum SUV of 6.2. The biapical scarring is very faintly hypermetabolic, and other areas of nodularity or bandlike density in the lungs likewise demonstrate low to moderate hypermetabolic activity. The appearance is concerning for multifocal adenocarcinoma, with the left upper lobe medial lesion having the highest degree of metabolic activity. Incidental CT findings: Atherosclerotic thoracic aorta. ABDOMEN/PELVIS: There is nodularity along the inferior margin of the tip of the tail the pancreas measuring about 1.9 by 0.9 cm on image 109 of series 4, with accentuated metabolic activity, maximum SUV 5.1. In comparison, the adjacent spleen has a typical SUV of 3.4 and activity in the pancreatic body is about SUV of 2.8. Accordingly, this lesion is unusual enough to potentially be a metastatic lesion or pancreatic neoplasm, and further workup with pancreatic protocol MRI with and without contrast is recommended. Incidental CT findings:  Cholelithiasis. Aortoiliac atherosclerotic vascular disease. SKELETON: Benign-appearing synovial activity in the left glenohumeral joint. Incidental CT findings: Dextroconvex lumbar scoliosis with rotary component and grade 1 degenerative anterolisthesis at L4-5. Lower cervical spondylosis. IMPRESSION: 1. The peripheral consolidative masses in the lungs are hypermetabolic, especially the slow growing medial left upper lobe lesion which has a maximum SUV of 10.3. Appearance concerning for multifocal adenocarcinoma. 2. Nodularity along the inferior margin of the tip of the tail the pancreas is hypermetabolic, and metastatic lesion or primary pancreatic malignancy not excluded. Pancreatic protocol MRI with and without contrast is recommended with attention to the inferior margin of the tip of the tail the pancreas. 3. Other imaging findings of potential clinical significance: Aortic Atherosclerosis (ICD10-I70.0). Cholelithiasis. Dextroconvex  lumbar scoliosis with rotary component. Electronically Signed   By: Van Clines M.D.   On: 01/20/2020 12:03   MR ABDOMEN MRCP W WO CONTAST  Result Date: 02/14/2020 CLINICAL DATA:  Question pancreatic lesion on recent PET evaluation. EXAM: MRI ABDOMEN WITHOUT AND WITH CONTRAST (INCLUDING MRCP) TECHNIQUE: Multiplanar multisequence MR imaging of the abdomen was performed both before and after the administration of intravenous contrast. Heavily T2-weighted images of the biliary and pancreatic ducts were obtained, and three-dimensional MRCP images were rendered by post processing. CONTRAST:  7.39mL GADAVIST GADOBUTROL 1 MMOL/ML IV SOLN COMPARISON:  CT of the chest from October 2021 and PET exam of January 20, 2020 FINDINGS: Lower chest: Areas of consolidative changes at the lung bases felt to be suspicious for multifocal adenocarcinoma are not well evaluated on the MRI assessment. Hepatobiliary: Atrophy of the LEFT hepatic lobe with isolated biliary duct dilation in the LEFT  hepatic lobe and attenuation of the LEFT hepatic duct on MRCP images such that it is not visible near the hepatic hilum or not clearly visible. Vague area of relative hypoenhancement seen on late arterial phase measuring 1.6 x 1.0 cm posterior to the LEFT portal vein. LEFT portal vein remains patent as do main and RIGHT portal venous structures. Area above becomes less conspicuous on later phases, it is unclear whether this represents volume averaging from subjacent fat or an intrahepatic lesion study mildly limited secondary to respiratory motion no intrahepatic ductal dilation in the RIGHT hepatic lobe or extrahepatic biliary duct dilation or pericholecystic inflammation. Pancreas: Area of that showed increased metabolic activity on the PET exam is hypointense relative to the pancreas on T1 weighted images. This does not display cystic features and enhances to a similar degree when compared to the adjacent pancreatic tissue. This appears to track into the phrenic 0 colic ligament/mesocolon and measures approximately 1.9 x 1.6 cm greatest dimension, assessment mildly limited by motion artifact Spleen:  Grossly normal spleen. Adrenals/Urinary Tract: Adrenal glands are normal. Symmetric renal enhancement. No hydronephrosis. Stomach/Bowel: No acute gastrointestinal process. Infiltration of the mesocolon with tendrils of enhancement that extend from the mass in the pancreatic tail at the level of the splenic flexure. Vascular/Lymphatic: Vascular structures in the abdomen are patent. There is no gastrohepatic or hepatoduodenal ligament lymphadenopathy. No retroperitoneal or mesenteric lymphadenopathy. Other:  Trace perihepatic ascites Musculoskeletal: No suspicious bone lesions identified. Spinal degenerative changes with dextroconvex scoliotic curvature in the lumbar spine. IMPRESSION: 1. Area in the tail of the pancreas may infiltrate the transverse mesocolon and is suspicious for pancreatic neoplasm. Due to  respiratory motion on today's study CT may be helpful to further evaluate this and the potential for central obstructing mass adjacent the LEFT porta hepatis in the setting of segmental/lobar isolated biliary duct distension. 2. Area of hypoenhancement versus volume-averaging but near the site of biliary duct transition raising the question of central obstructing lesion. Given respiratory variation and problems with volume averaging on the current study CT may be helpful, utilizing thin section pancreatic protocol, encompassing the entire liver and pancreas to further assess both of these abnormalities. Endoscopic assessment and or ERCP could also be considered as warranted based on clinical symptoms as a more aggressive approach. Electronically Signed   By: Zetta Bills M.D.   On: 02/14/2020 11:42    Labs:  CBC: Recent Labs    02/16/20 1011  WBC 5.6  HGB 12.2  HCT 38.9  PLT 194    COAGS: Recent Labs    02/16/20 1011  INR 1.0    BMP: No results for input(s): NA, K, CL, CO2, GLUCOSE, BUN, CALCIUM, CREATININE, GFRNONAA, GFRAA in the last 8760 hours.  Invalid input(s): CMP  LIVER FUNCTION TESTS: No results for input(s): BILITOT, AST, ALT, ALKPHOS, PROT, ALBUMIN in the last 8760 hours.  TUMOR MARKERS: No results for input(s): AFPTM, CEA, CA199, CHROMGRNA in the last 8760 hours.  Assessment and Plan: Patient with past medical history of pneumonia vs. Multiple infiltrative process in 2019 presents with complaint of enlarging pulmonary nodules by CT.  IR consulted for lung nodule biopsy at the request of Dr. Ander Slade. Case reviewed by Dr. Vernard Gambles who approves patient for procedure.  Patient presents today in their usual state of health.  She has been NPO and is not currently on blood thinners.  Performing MD, Dr. Pascal Lux, has met with patient to discuss her high risk biopsy.  There is reason for concern related to the anterior/parasternal lesion, however needle biopsy of this area is  challenging and with increased risk.  She also has nodular density at the lung base which may be more accessible.  After discussion, plan made to proceed with lung base nodule biopsy if area similar in appearance by CT today. Pending outcome of biopsy, if performed, may need to reconsider biopsy of medial left upper lob mass.  This may require further discussion with Pulmonology prior to scheduling.   Risks and benefits of CT guided lung nodule biopsy was discussed with the patient including, but not limited to bleeding, hemoptysis, respiratory failure requiring intubation, infection, pneumothorax requiring chest tube placement, stroke from air embolism or even death.  All of the patient's questions were answered and the patient is agreeable to proceed.  Consent signed and in chart.  Thank you for this interesting consult.  I greatly enjoyed meeting Mirta V Mahaney and look forward to participating in their care.  A copy of this report was sent to the requesting provider on this date.  Electronically Signed: Docia Barrier, PA 02/16/2020, 11:45 AM   I spent a total of  30 Minutes   in face to face in clinical consultation, greater than 50% of which was counseling/coordinating care for lung mass.

## 2020-02-16 NOTE — Progress Notes (Signed)
Interventional Radiology Brief Note: CXR reviewed by Dr. Pascal Lux.  No complication. [place] for patient to eat. Called for RN in short stay to notify.   Brynda Greathouse, MS RD PA-C

## 2020-02-16 NOTE — Procedures (Signed)
Pre procedural Dx: Hypermetabolic pulmonary masses Post procedural Dx: Same  Technically successful CT guided biopsy of hypermetabolic left lower lobe pulmonary consolidation.   EBL: None.  Complications: Trace asymptomatic, non-enlarging PTX.   Ronny Bacon, MD Pager #: [phone]

## 2020-02-16 NOTE — Sedation Documentation (Signed)
Patient is resting comfortably. 

## 2020-02-23 ENCOUNTER — Telehealth: Payer: Self-pay | Admitting: Pulmonary Disease

## 2020-02-23 DIAGNOSIS — R911 Solitary pulmonary nodule: Secondary | ICD-10-CM

## 2020-02-23 DIAGNOSIS — R9389 Abnormal findings on diagnostic imaging of other specified body structures: Secondary | ICD-10-CM

## 2020-02-23 LAB — SURGICAL PATHOLOGY

## 2020-02-23 NOTE — Telephone Encounter (Signed)
I did update patient about biopsy results  Please schedule a CT scan with contrast 6 months from now

## 2020-02-23 NOTE — Telephone Encounter (Signed)
CT w/ contrast ordered for 6 months.  Nothing further at this time.

## 2020-02-23 NOTE — Telephone Encounter (Signed)
Called and spoke with pt's husband who states that they are requesting to know the results of the CT biopsy that was performed 12/2. Dr. Jenetta Downer, please advise.

## 2020-03-02 DIAGNOSIS — I10 Essential (primary) hypertension: Secondary | ICD-10-CM | POA: Diagnosis not present

## 2020-03-02 DIAGNOSIS — I4891 Unspecified atrial fibrillation: Secondary | ICD-10-CM | POA: Diagnosis not present

## 2020-03-02 DIAGNOSIS — N2581 Secondary hyperparathyroidism of renal origin: Secondary | ICD-10-CM | POA: Diagnosis not present

## 2020-03-02 DIAGNOSIS — E7800 Pure hypercholesterolemia, unspecified: Secondary | ICD-10-CM | POA: Diagnosis not present

## 2020-03-02 DIAGNOSIS — E1122 Type 2 diabetes mellitus with diabetic chronic kidney disease: Secondary | ICD-10-CM | POA: Diagnosis not present

## 2020-03-02 DIAGNOSIS — N189 Chronic kidney disease, unspecified: Secondary | ICD-10-CM | POA: Diagnosis not present

## 2020-03-02 DIAGNOSIS — D631 Anemia in chronic kidney disease: Secondary | ICD-10-CM | POA: Diagnosis not present

## 2020-03-26 DIAGNOSIS — R933 Abnormal findings on diagnostic imaging of other parts of digestive tract: Secondary | ICD-10-CM | POA: Diagnosis not present

## 2020-04-04 DIAGNOSIS — N2581 Secondary hyperparathyroidism of renal origin: Secondary | ICD-10-CM | POA: Diagnosis not present

## 2020-04-04 DIAGNOSIS — Z0000 Encounter for general adult medical examination without abnormal findings: Secondary | ICD-10-CM | POA: Diagnosis not present

## 2020-04-04 DIAGNOSIS — D631 Anemia in chronic kidney disease: Secondary | ICD-10-CM | POA: Diagnosis not present

## 2020-04-04 DIAGNOSIS — I129 Hypertensive chronic kidney disease with stage 1 through stage 4 chronic kidney disease, or unspecified chronic kidney disease: Secondary | ICD-10-CM | POA: Diagnosis not present

## 2020-04-04 DIAGNOSIS — E1122 Type 2 diabetes mellitus with diabetic chronic kidney disease: Secondary | ICD-10-CM | POA: Diagnosis not present

## 2020-04-04 DIAGNOSIS — E782 Mixed hyperlipidemia: Secondary | ICD-10-CM | POA: Diagnosis not present

## 2020-04-04 DIAGNOSIS — N1832 Chronic kidney disease, stage 3b: Secondary | ICD-10-CM | POA: Diagnosis not present

## 2020-04-04 DIAGNOSIS — I4892 Unspecified atrial flutter: Secondary | ICD-10-CM | POA: Diagnosis not present

## 2020-05-16 DIAGNOSIS — M1711 Unilateral primary osteoarthritis, right knee: Secondary | ICD-10-CM | POA: Diagnosis not present

## 2020-05-16 DIAGNOSIS — M25512 Pain in left shoulder: Secondary | ICD-10-CM | POA: Diagnosis not present

## 2020-05-25 DIAGNOSIS — Z780 Asymptomatic menopausal state: Secondary | ICD-10-CM | POA: Diagnosis not present

## 2020-05-31 DIAGNOSIS — M25512 Pain in left shoulder: Secondary | ICD-10-CM | POA: Diagnosis not present

## 2020-05-31 DIAGNOSIS — M19012 Primary osteoarthritis, left shoulder: Secondary | ICD-10-CM | POA: Diagnosis not present

## 2020-06-01 ENCOUNTER — Other Ambulatory Visit: Payer: Self-pay | Admitting: Cardiovascular Disease

## 2020-06-01 NOTE — Telephone Encounter (Signed)
Prescription refill request for Eliquis received.  Indication: aflutter Last office visit: 12/16/2019, Nishan Scr: 1.36, 01/12/2020 Age: 86 yo Weight: 66.7 kg   Pt is on the correct dose of Eliquis per dosing criteria, prescription refill sent for Eliquis 5mg  BID.

## 2020-06-06 DIAGNOSIS — M25512 Pain in left shoulder: Secondary | ICD-10-CM | POA: Diagnosis not present

## 2020-06-06 DIAGNOSIS — M19012 Primary osteoarthritis, left shoulder: Secondary | ICD-10-CM | POA: Diagnosis not present

## 2020-06-08 DIAGNOSIS — M25512 Pain in left shoulder: Secondary | ICD-10-CM | POA: Diagnosis not present

## 2020-06-08 DIAGNOSIS — M19012 Primary osteoarthritis, left shoulder: Secondary | ICD-10-CM | POA: Diagnosis not present

## 2020-06-08 NOTE — Progress Notes (Signed)
Cardiology Office Note:    Date:  06/15/2020   ID:  Darlene Wang, DOB 1934-12-20, MRN [id number]  PCP:  [name], MD  Cardiologist:  [name], MD   Referring MD: [name], MD   No chief complaint on file.   History of Present Illness:    86 y.o. seen in f/u for atypical flutter. 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 EF normal 60-65% mild MR/AR   Of noted, CT chest on 06/09/17 read as multilobar bronchopneumonia with recommendations for a follow up CXR in 4-6 weeks. She did not receive ABX during her admission and was afebrile without leukocytosis. She reported playing the clarinet without difficulty, and has known apical scarring. She indicates abnormal CXR;s for years Last CT 05/21/18 showed more LUL consolidation    She has no bleeding problems on eliquis. ASA was D/C'ed.  Eliquis dose is borderline for reduction to 2.5 mg bid Dosing with GFR 36 and Cr 1.46 with age over 45   No cardiac complaints  Her and husband at [place] [facility] Has had vaccine   She has chronically abnormal CXR/CT with abnormal PET scan 01/20/20 Had biopsy of LLL on 02/16/20  Needs f/u with pulmonary [name] to discuss results Pancrease also shown to be abnormal on PET scan ? Metastatic adenocarcinoma She is not happy with pulmonary never called her with biopsy results When she called indicated not cancer. She is seeing [name] for pancrease   She has had no weight loss or constitutional signs of cancer She has had more diuresis with [name]   Past Medical History:  Diagnosis Date  . CKD (chronic kidney disease)    stage 3   . Diabetes mellitus without complication (HCC)    pre-diabetes   . Hypertension   . Pre-diabetes     Past Surgical History:  Procedure Laterality Date  . CARDIOVERSION N/A 06/10/2017   Procedure: CARDIOVERSION;   Surgeon: Sueanne Margarita, MD;  Location: [facility] ENDOSCOPY;  Service: Cardiovascular;  Laterality: N/A;  . TEE WITHOUT CARDIOVERSION N/A 06/10/2017   Procedure: TRANSESOPHAGEAL ECHOCARDIOGRAM (TEE);  Surgeon: Sueanne Margarita, MD;  Location: [facility] ENDOSCOPY;  Service: Cardiovascular;  Laterality: N/A;    Current Medications: Current Meds  Medication Sig  . acetaminophen (TYLENOL) 650 MG CR tablet Take 650 mg by mouth every 8 (eight) hours as needed for pain.  Marland Kitchen apixaban (ELIQUIS) 5 MG TABS tablet Take 1 tablet (5 mg total) by mouth 2 (two) times daily.  Marland Kitchen atorvastatin (LIPITOR) 40 MG tablet Take 40 mg by mouth at bedtime.  . Calcium Carbonate-Vitamin D 600-200 MG-UNIT TABS Take 600 mg by mouth every other day.   . cholecalciferol (VITAMIN D) 1000 units tablet Take 1,000 Units by mouth daily.  . dapagliflozin propanediol (FARXIGA) 5 MG TABS tablet Take 5 mg by mouth daily.  Marland Kitchen estrogens, conjugated, (PREMARIN) 0.45 MG tablet Take 0.45 mg by mouth every other day.  . lisinopril (ZESTRIL) 5 MG tablet Take 5 mg by mouth at bedtime.   . metoprolol tartrate (LOPRESSOR) 25 MG tablet TAKE 1 TABLET BY MOUTH  TWICE DAILY  . Multiple Vitamin (MULTIVITAMIN) capsule Take 1 capsule by mouth daily. Centrum     Allergies:   Amoxicillin, Pneumococcal vac polyvalent, Pneumococcal vaccine, Tuberculin ppd, and Tuberculin tests   Social History   Socioeconomic History  . Marital  status: Married    Spouse name: Not on file  . Number of children: Not on file  . Years of education: Not on file  . Highest education level: Not on file  Occupational History  . Not on file  Tobacco Use  . Smoking status: Never Smoker  . Smokeless tobacco: Never Used  Vaping Use  . Vaping Use: Never used  Substance and Sexual Activity  . Alcohol use: Not on file  . Drug use: Never  . Sexual activity: Not on file  Other Topics Concern  . Not on file  Social History Narrative  . Not on file   Social Determinants of Health    Financial Resource Strain: Not on file  Food Insecurity: Not on file  Transportation Needs: Not on file  Physical Activity: Not on file  Stress: Not on file  Social Connections: Not on file     Family History: The patient's family history includes Diabetes in her father; Heart failure in her mother; Stroke in her father and sister.  ROS:   Please see the history of present illness.     All other systems reviewed and are negative.  EKGs/Labs/Other Studies Reviewed:    The following studies were reviewed today:  Echocardiogram 06/09/17: Study Conclusions - Left ventricle: The cavity size was normal. Wall thickness was increased in a pattern of mild LVH. Systolic function was normal. The estimated ejection fraction was in the range of 60% to 65%.Wall motion was normal; there were no regional wall motionabnormalities. The study is not technically sufficient to allow evaluation of LV diastolic function. - Aortic valve: There was trivial regurgitation. - Mitral valve: There was mild regurgitation.  Impressions: - Normal LV function; mild LVH; trace AI; mild MR and TR.   TEE with DCCV: 06/10/17 Successful Aflutter>>NSR Results: Normal LV size and functionwith EF60-65% Normal RV size and function Normal RA Mildly dilatedLAwith spontaneous echo contrast. There is no thrombus in LAA or LA. The LAA emptying velocity is mildly reduced at 35. Normal TVwith mild to moderate TR Normal PVwith mild PR Normal MVwith mild MR Normal trileaflet AVwith mild AR Normal interatrial septum with no evidence of shunt by colorflow dopper  Normal thoracic and ascending aorta.   EKG:  12/02/18 SR rate 76 nonspecific ST changes   Recent Labs: 02/16/2020: Hemoglobin 12.2; Platelets 194  Recent Lipid Panel    Component Value Date/Time   CHOL 111 06/09/2017 0432   TRIG 88 06/09/2017 0432   HDL 60 06/09/2017 0432   CHOLHDL 1.9 06/09/2017 0432   VLDL 18 06/09/2017 0432   LDLCALC  33 06/09/2017 0432    Physical Exam:    VS:  BP 140/72   Pulse 71   Ht 5\' 6"  (1.676 m)   Wt 65.8 kg   SpO2 97%   BMI 23.40 kg/m     Wt Readings from Last 3 Encounters:  06/15/20 65.8 kg  02/16/20 66.7 kg  01/03/20 66.7 kg     Affect appropriate Healthy:  appears stated age 62: normal Neck supple with no adenopathy JVP normal no bruits no thyromegaly Lungs clear with no wheezing and good diaphragmatic motion Heart:  S1/S2 no murmur, no rub, gallop or click PMI normal Abdomen: benighn, BS positve, no tenderness, no AAA no bruit.  No HSM or HJR Distal pulses intact with no bruits No edema Neuro non-focal Skin warm and dry No muscular weakness   ASSESSMENT:    Atrial flutter  PLAN:    In order  of problems listed above:  Flutter:  Post TEE/DCC 06/10/17  Initially On low dose eliquis given age and CKD Digoxin d/c last visit Continue beta blocker most recent CR on 12/17/18 1.42 GFR 36 EF normal by TEE 06/10/17 with mild LAE   CKD (chronic kidney disease) stage 3  CR 1. 36   Stable f/u with Dr Justin Mend  Essential hypertension   Pressures well-controlled on current regimen. No medication changes.  Pre-diabetes  A1c 6.1  Now on Farxiga   Pulmonary:  Chronically abnormal CXR/CT  Biopsy LLL done 02/16/20 abnormal PET scan including ? Mets to pancrease She indicates that she had to call Dr Ander Slade to get results of biopsy and was told Negative and that Dr Cristina Gong is involved with pancreatic issue She has no weigh loss, functional decrement   F/U 6 months    [name]

## 2020-06-11 DIAGNOSIS — M25512 Pain in left shoulder: Secondary | ICD-10-CM | POA: Diagnosis not present

## 2020-06-11 DIAGNOSIS — M19012 Primary osteoarthritis, left shoulder: Secondary | ICD-10-CM | POA: Diagnosis not present

## 2020-06-11 DIAGNOSIS — E1169 Type 2 diabetes mellitus with other specified complication: Secondary | ICD-10-CM | POA: Diagnosis not present

## 2020-06-11 DIAGNOSIS — N189 Chronic kidney disease, unspecified: Secondary | ICD-10-CM | POA: Diagnosis not present

## 2020-06-11 DIAGNOSIS — D631 Anemia in chronic kidney disease: Secondary | ICD-10-CM | POA: Diagnosis not present

## 2020-06-11 DIAGNOSIS — I4891 Unspecified atrial fibrillation: Secondary | ICD-10-CM | POA: Diagnosis not present

## 2020-06-11 DIAGNOSIS — N2581 Secondary hyperparathyroidism of renal origin: Secondary | ICD-10-CM | POA: Diagnosis not present

## 2020-06-11 DIAGNOSIS — I10 Essential (primary) hypertension: Secondary | ICD-10-CM | POA: Diagnosis not present

## 2020-06-11 DIAGNOSIS — E1122 Type 2 diabetes mellitus with diabetic chronic kidney disease: Secondary | ICD-10-CM | POA: Diagnosis not present

## 2020-06-11 DIAGNOSIS — E7800 Pure hypercholesterolemia, unspecified: Secondary | ICD-10-CM | POA: Diagnosis not present

## 2020-06-12 DIAGNOSIS — Z7901 Long term (current) use of anticoagulants: Secondary | ICD-10-CM | POA: Diagnosis not present

## 2020-06-12 DIAGNOSIS — E1169 Type 2 diabetes mellitus with other specified complication: Secondary | ICD-10-CM | POA: Diagnosis not present

## 2020-06-12 DIAGNOSIS — E7800 Pure hypercholesterolemia, unspecified: Secondary | ICD-10-CM | POA: Diagnosis not present

## 2020-06-12 DIAGNOSIS — I4892 Unspecified atrial flutter: Secondary | ICD-10-CM | POA: Diagnosis not present

## 2020-06-12 DIAGNOSIS — N189 Chronic kidney disease, unspecified: Secondary | ICD-10-CM | POA: Diagnosis not present

## 2020-06-12 DIAGNOSIS — I10 Essential (primary) hypertension: Secondary | ICD-10-CM | POA: Diagnosis not present

## 2020-06-12 DIAGNOSIS — R933 Abnormal findings on diagnostic imaging of other parts of digestive tract: Secondary | ICD-10-CM | POA: Diagnosis not present

## 2020-06-12 DIAGNOSIS — Z79890 Hormone replacement therapy (postmenopausal): Secondary | ICD-10-CM | POA: Diagnosis not present

## 2020-06-12 DIAGNOSIS — R9389 Abnormal findings on diagnostic imaging of other specified body structures: Secondary | ICD-10-CM | POA: Diagnosis not present

## 2020-06-13 DIAGNOSIS — Z961 Presence of intraocular lens: Secondary | ICD-10-CM | POA: Diagnosis not present

## 2020-06-13 DIAGNOSIS — H52203 Unspecified astigmatism, bilateral: Secondary | ICD-10-CM | POA: Diagnosis not present

## 2020-06-13 DIAGNOSIS — H524 Presbyopia: Secondary | ICD-10-CM | POA: Diagnosis not present

## 2020-06-13 DIAGNOSIS — R7303 Prediabetes: Secondary | ICD-10-CM | POA: Diagnosis not present

## 2020-06-13 DIAGNOSIS — H35371 Puckering of macula, right eye: Secondary | ICD-10-CM | POA: Diagnosis not present

## 2020-06-13 DIAGNOSIS — G43B0 Ophthalmoplegic migraine, not intractable: Secondary | ICD-10-CM | POA: Diagnosis not present

## 2020-06-13 DIAGNOSIS — H5213 Myopia, bilateral: Secondary | ICD-10-CM | POA: Diagnosis not present

## 2020-06-13 DIAGNOSIS — H40013 Open angle with borderline findings, low risk, bilateral: Secondary | ICD-10-CM | POA: Diagnosis not present

## 2020-06-15 ENCOUNTER — Other Ambulatory Visit: Payer: Self-pay

## 2020-06-15 ENCOUNTER — Ambulatory Visit (INDEPENDENT_AMBULATORY_CARE_PROVIDER_SITE_OTHER): Payer: Medicare Other | Admitting: Cardiovascular Disease

## 2020-06-15 ENCOUNTER — Encounter: Payer: Self-pay | Admitting: Cardiovascular Disease

## 2020-06-15 VITALS — BP 140/72 | HR 71 | Ht 66.0 in | Wt 145.0 lb

## 2020-06-15 DIAGNOSIS — I480 Paroxysmal atrial fibrillation: Secondary | ICD-10-CM | POA: Diagnosis not present

## 2020-06-15 NOTE — Patient Instructions (Signed)

## 2020-06-20 DIAGNOSIS — M25512 Pain in left shoulder: Secondary | ICD-10-CM | POA: Diagnosis not present

## 2020-06-20 DIAGNOSIS — M19012 Primary osteoarthritis, left shoulder: Secondary | ICD-10-CM | POA: Diagnosis not present

## 2020-06-22 DIAGNOSIS — M19012 Primary osteoarthritis, left shoulder: Secondary | ICD-10-CM | POA: Diagnosis not present

## 2020-06-22 DIAGNOSIS — M25512 Pain in left shoulder: Secondary | ICD-10-CM | POA: Diagnosis not present

## 2020-06-27 DIAGNOSIS — M19012 Primary osteoarthritis, left shoulder: Secondary | ICD-10-CM | POA: Diagnosis not present

## 2020-06-27 DIAGNOSIS — M25512 Pain in left shoulder: Secondary | ICD-10-CM | POA: Diagnosis not present

## 2020-06-29 DIAGNOSIS — M25512 Pain in left shoulder: Secondary | ICD-10-CM | POA: Diagnosis not present

## 2020-06-29 DIAGNOSIS — M19012 Primary osteoarthritis, left shoulder: Secondary | ICD-10-CM | POA: Diagnosis not present

## 2020-07-04 DIAGNOSIS — M19012 Primary osteoarthritis, left shoulder: Secondary | ICD-10-CM | POA: Diagnosis not present

## 2020-07-04 DIAGNOSIS — M25512 Pain in left shoulder: Secondary | ICD-10-CM | POA: Diagnosis not present

## 2020-07-09 DIAGNOSIS — M25512 Pain in left shoulder: Secondary | ICD-10-CM | POA: Diagnosis not present

## 2020-07-09 DIAGNOSIS — M19012 Primary osteoarthritis, left shoulder: Secondary | ICD-10-CM | POA: Diagnosis not present

## 2020-07-11 DIAGNOSIS — M25512 Pain in left shoulder: Secondary | ICD-10-CM | POA: Diagnosis not present

## 2020-07-11 DIAGNOSIS — M19012 Primary osteoarthritis, left shoulder: Secondary | ICD-10-CM | POA: Diagnosis not present

## 2020-07-17 DIAGNOSIS — M19012 Primary osteoarthritis, left shoulder: Secondary | ICD-10-CM | POA: Diagnosis not present

## 2020-07-17 DIAGNOSIS — M25512 Pain in left shoulder: Secondary | ICD-10-CM | POA: Diagnosis not present

## 2020-07-19 DIAGNOSIS — M25512 Pain in left shoulder: Secondary | ICD-10-CM | POA: Diagnosis not present

## 2020-07-19 DIAGNOSIS — M19012 Primary osteoarthritis, left shoulder: Secondary | ICD-10-CM | POA: Diagnosis not present

## 2020-07-23 DIAGNOSIS — E1169 Type 2 diabetes mellitus with other specified complication: Secondary | ICD-10-CM | POA: Diagnosis not present

## 2020-07-23 DIAGNOSIS — I10 Essential (primary) hypertension: Secondary | ICD-10-CM | POA: Diagnosis not present

## 2020-07-23 DIAGNOSIS — M25512 Pain in left shoulder: Secondary | ICD-10-CM | POA: Diagnosis not present

## 2020-07-23 DIAGNOSIS — E7800 Pure hypercholesterolemia, unspecified: Secondary | ICD-10-CM | POA: Diagnosis not present

## 2020-07-23 DIAGNOSIS — M19012 Primary osteoarthritis, left shoulder: Secondary | ICD-10-CM | POA: Diagnosis not present

## 2020-07-25 DIAGNOSIS — R933 Abnormal findings on diagnostic imaging of other parts of digestive tract: Secondary | ICD-10-CM | POA: Diagnosis not present

## 2020-07-25 DIAGNOSIS — R978 Other abnormal tumor markers: Secondary | ICD-10-CM | POA: Diagnosis not present

## 2020-07-26 DIAGNOSIS — M25512 Pain in left shoulder: Secondary | ICD-10-CM | POA: Diagnosis not present

## 2020-07-26 DIAGNOSIS — M19012 Primary osteoarthritis, left shoulder: Secondary | ICD-10-CM | POA: Diagnosis not present

## 2020-08-10 ENCOUNTER — Other Ambulatory Visit: Payer: Medicare Other

## 2020-08-17 ENCOUNTER — Ambulatory Visit
Admission: RE | Admit: 2020-08-17 | Discharge: 2020-08-17 | Disposition: A | Discharge status: Home / Self Care | Payer: Medicare Other | Source: Ambulatory Visit | Attending: Pulmonary Disease | Admitting: Pulmonary Disease

## 2020-08-17 ENCOUNTER — Other Ambulatory Visit: Payer: Self-pay

## 2020-08-17 DIAGNOSIS — I517 Cardiomegaly: Secondary | ICD-10-CM | POA: Diagnosis not present

## 2020-08-17 DIAGNOSIS — R9389 Abnormal findings on diagnostic imaging of other specified body structures: Secondary | ICD-10-CM

## 2020-08-17 DIAGNOSIS — K8020 Calculus of gallbladder without cholecystitis without obstruction: Secondary | ICD-10-CM | POA: Diagnosis not present

## 2020-08-17 DIAGNOSIS — J984 Other disorders of lung: Secondary | ICD-10-CM | POA: Diagnosis not present

## 2020-08-17 DIAGNOSIS — R918 Other nonspecific abnormal finding of lung field: Secondary | ICD-10-CM | POA: Diagnosis not present

## 2020-08-17 DIAGNOSIS — R911 Solitary pulmonary nodule: Secondary | ICD-10-CM

## 2020-08-17 MED ORDER — IOPAMIDOL (ISOVUE-300) INJECTION 61%
75.0000 mL | Freq: Once | INTRAVENOUS | Status: AC | PRN
  Administered 2020-08-17: 50 mL via INTRAVENOUS

## 2020-08-24 DIAGNOSIS — N2581 Secondary hyperparathyroidism of renal origin: Secondary | ICD-10-CM | POA: Diagnosis not present

## 2020-08-24 DIAGNOSIS — D631 Anemia in chronic kidney disease: Secondary | ICD-10-CM | POA: Diagnosis not present

## 2020-08-24 DIAGNOSIS — N189 Chronic kidney disease, unspecified: Secondary | ICD-10-CM | POA: Diagnosis not present

## 2020-08-24 DIAGNOSIS — E1122 Type 2 diabetes mellitus with diabetic chronic kidney disease: Secondary | ICD-10-CM | POA: Diagnosis not present

## 2020-08-24 DIAGNOSIS — E1169 Type 2 diabetes mellitus with other specified complication: Secondary | ICD-10-CM | POA: Diagnosis not present

## 2020-08-24 DIAGNOSIS — I10 Essential (primary) hypertension: Secondary | ICD-10-CM | POA: Diagnosis not present

## 2020-08-24 DIAGNOSIS — I4891 Unspecified atrial fibrillation: Secondary | ICD-10-CM | POA: Diagnosis not present

## 2020-08-24 DIAGNOSIS — E7800 Pure hypercholesterolemia, unspecified: Secondary | ICD-10-CM | POA: Diagnosis not present

## 2020-10-23 DIAGNOSIS — N2581 Secondary hyperparathyroidism of renal origin: Secondary | ICD-10-CM | POA: Diagnosis not present

## 2020-10-23 DIAGNOSIS — E1122 Type 2 diabetes mellitus with diabetic chronic kidney disease: Secondary | ICD-10-CM | POA: Diagnosis not present

## 2020-10-23 DIAGNOSIS — E7800 Pure hypercholesterolemia, unspecified: Secondary | ICD-10-CM | POA: Diagnosis not present

## 2020-10-23 DIAGNOSIS — D631 Anemia in chronic kidney disease: Secondary | ICD-10-CM | POA: Diagnosis not present

## 2020-10-23 DIAGNOSIS — N189 Chronic kidney disease, unspecified: Secondary | ICD-10-CM | POA: Diagnosis not present

## 2020-10-23 DIAGNOSIS — I4891 Unspecified atrial fibrillation: Secondary | ICD-10-CM | POA: Diagnosis not present

## 2020-10-23 DIAGNOSIS — I10 Essential (primary) hypertension: Secondary | ICD-10-CM | POA: Diagnosis not present

## 2020-10-23 DIAGNOSIS — E1169 Type 2 diabetes mellitus with other specified complication: Secondary | ICD-10-CM | POA: Diagnosis not present

## 2020-11-06 DIAGNOSIS — Z20822 Contact with and (suspected) exposure to covid-19: Secondary | ICD-10-CM | POA: Diagnosis not present

## 2020-11-06 DIAGNOSIS — U071 COVID-19: Secondary | ICD-10-CM | POA: Diagnosis not present

## 2020-11-22 ENCOUNTER — Other Ambulatory Visit: Payer: Self-pay | Admitting: Cardiovascular Disease

## 2020-11-22 NOTE — Telephone Encounter (Signed)
Pt last saw Dr Johnsie Cancel 06/15/20, last labs 01/12/20 Creat 1.36, age 86, weight 65.8kg, based on specified criteria pt is on appropriate dosage of Eliquis '5mg'$  BID.  Will refill rx.

## 2020-12-13 ENCOUNTER — Ambulatory Visit: Payer: Medicare Other | Admitting: Cardiovascular Disease

## 2020-12-18 NOTE — Progress Notes (Signed)
Cardiology Office Note:    Date:  12/21/2020   ID:  Darlene Wang, DOB Dec 25, 1934, MRN [id number]  PCP:  Darlene Nip, MD  Cardiologist:  Darlene Rouge, MD   Referring MD: Darlene Ruff, MD   No chief complaint on file.   History of Present Illness:    86 y.o. seen in f/u for atypical flutter. 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 EF normal 60-65% mild MR/AR   Of noted, CT chest on 06/09/17 read as multilobar bronchopneumonia with recommendations for a follow up CXR in 4-6 weeks. She did not receive ABX during her admission and was afebrile without leukocytosis. She reported playing the clarinet without difficulty, and has known apical scarring. She indicates abnormal CXR;s for years Last CT 05/21/18 showed more LUL consolidation    She has no bleeding problems on eliquis. ASA was D/C'ed.  Eliquis dose is borderline for reduction to 2.5 mg bid Dosing with GFR 36 and Cr 1.46 with age over 59   No cardiac complaints  Her and husband at [place] [facility] Has had vaccine   She has chronically abnormal CXR/CT with abnormal PET scan 01/20/20 Had biopsy of LLL on 02/16/20 Needs f/u with pulmonary Darlene Wang to discuss results Pancrease also shown to be abnormal on PET scan ? Metastatic adenocarcinoma She is not happy with pulmonary never called her with biopsy results When she called indicated not cancer. She is seeing Darlene Wang for pancrease   CT 08/17/20 stable multifocal peripheral consolidative opacities in both lungs  ? Eosinophilic pneumonia   She has had no weight loss or constitutional signs of cancer She has had more diuresis with Darlene Wang   Had nice trips to [name] and Darlene Wang. 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  Past Medical History:  Diagnosis Date   CKD (chronic kidney disease)    stage 3    Diabetes  mellitus without complication ([place])    pre-diabetes    Hypertension    Pre-diabetes     Past Surgical History:  Procedure Laterality Date   CARDIOVERSION N/A 06/10/2017   Procedure: CARDIOVERSION;  Surgeon: Darlene Margarita, MD;  Location: [facility] ENDOSCOPY;  Service: Cardiovascular;  Laterality: N/A;   TEE WITHOUT CARDIOVERSION N/A 06/10/2017   Procedure: TRANSESOPHAGEAL ECHOCARDIOGRAM (TEE);  Surgeon: Darlene Margarita, MD;  Location: [facility] ENDOSCOPY;  Service: Cardiovascular;  Laterality: N/A;    Current Medications: Current Meds  Medication Sig   acetaminophen (TYLENOL) 650 MG CR tablet Take 650 mg by mouth every 8 (eight) hours as needed for pain.   apixaban (ELIQUIS) 5 MG TABS tablet TAKE 1 TABLET BY MOUTH  TWICE DAILY   atorvastatin (LIPITOR) 40 MG tablet Take 40 mg by mouth at bedtime.   Calcium Carbonate-Vitamin D 600-200 MG-UNIT TABS Take 600 mg by mouth every other day.    cholecalciferol (VITAMIN D) 1000 units tablet Take 1,000 Units by mouth daily.   dapagliflozin propanediol (FARXIGA) 5 MG TABS tablet Take 5 mg by mouth daily.   estrogens, conjugated, (PREMARIN) 0.45 MG tablet Take 0.45 mg by mouth every other day.   lisinopril (ZESTRIL) 5 MG tablet Take 5 mg by mouth at bedtime.    metoprolol tartrate (LOPRESSOR) 25 MG tablet TAKE 1 TABLET BY MOUTH  TWICE DAILY   Multiple Vitamin (MULTIVITAMIN) capsule Take 1 capsule by mouth daily.  Centrum     Allergies:   Amoxicillin, Pneumococcal vac polyvalent, Pneumococcal vaccine, Tuberculin ppd, and Tuberculin tests   Social History   Socioeconomic History   Marital status: Married    Spouse name: Not on file   Number of children: Not on file   Years of education: Not on file   Highest education level: Not on file  Occupational History   Not on file  Tobacco Use   Smoking status: Never   Smokeless tobacco: Never  Vaping Use   Vaping Use: Never used  Substance and Sexual Activity   Alcohol use: Not on file   Drug use: Never    Sexual activity: Not on file  Other Topics Concern   Not on file  Social History Narrative   Not on file   Social Determinants of Health   Financial Resource Strain: Not on file  Food Insecurity: Not on file  Transportation Needs: Not on file  Physical Activity: Not on file  Stress: Not on file  Social Connections: Not on file     Family History: The patient's family history includes Diabetes in her father; Heart failure in her mother; Stroke in her father and sister.  ROS:   Please see the history of present illness.     All other systems reviewed and are negative.  EKGs/Labs/Other Studies Reviewed:    The following studies were reviewed today:  Echocardiogram 06/09/17: Study Conclusions  - Left ventricle: The cavity size was normal. Wall thickness was increased in a pattern of mild LVH. Systolic function was normal. The estimated ejection fraction was in the range of 60% to 65%. Wall motion was normal; there were no regional wall motion abnormalities. The study is not technically sufficient to allow evaluation of LV diastolic function. - Aortic valve: There was trivial regurgitation. - Mitral valve: There was mild regurgitation.   Impressions:  - Normal LV function; mild LVH; trace AI; mild MR and TR.     TEE with DCCV: 06/10/17 Successful Aflutter>>NSR Results: Normal LV size and function with EF 60-65% Normal RV size and function Normal RA Mildly dilated LA with spontaneous echo contrast.  There is no thrombus in LAA or LA.  The LAA emptying velocity is mildly reduced at 35. Normal TV with mild to moderate TR Normal PV with mild PR Normal MV with mild MR Normal trileaflet AV with mild AR Normal interatrial septum with no evidence of shunt by colorflow dopper  Normal thoracic and ascending aorta.   EKG:  12/02/18 SR rate 76 nonspecific ST changes  12/21/2020 NSR rate 59 normal   Recent Labs: 02/16/2020: Hemoglobin 12.2; Platelets 194  Recent Lipid Panel     Component Value Date/Time   CHOL 111 06/09/2017 0432   TRIG 88 06/09/2017 0432   HDL 60 06/09/2017 0432   CHOLHDL 1.9 06/09/2017 0432   VLDL 18 06/09/2017 0432   LDLCALC 33 06/09/2017 0432    Physical Exam:    VS:  BP 110/62   Pulse (!) 59   Ht 5\' 6"  (1.676 m)   Wt 66.7 kg   SpO2 96%   BMI 23.73 kg/m     Wt Readings from Last 3 Encounters:  12/21/20 66.7 kg  06/15/20 65.8 kg  02/16/20 66.7 kg     Affect appropriate Healthy:  appears stated age 24: normal Neck supple with no adenopathy JVP normal no bruits no thyromegaly Lungs clear with no wheezing and good diaphragmatic motion Heart:  S1/S2 no murmur, no rub,  gallop or click PMI normal Abdomen: benighn, BS positve, no tenderness, no AAA no bruit.  No HSM or HJR Distal pulses intact with no bruits No edema Neuro non-focal Skin warm and dry No muscular weakness   ASSESSMENT:    Atrial flutter  PLAN:    In order of problems listed above:  Flutter:  Post TEE/DCC 06/10/17  Initially On low dose eliquis given age and CKD Digoxin d/c last visit Continue beta blocker most recent CR on 12/17/18 1.42 GFR 36 EF normal by TEE 06/10/17 with mild LAE  Her weight is above 60 kg and her last Cr we have was 1.36 so she qualified for normal dose eliquis despite age. She knows to keep close track of her Cr as she will eventually likely need decrease in eliquis dose to 2.5 bid  CKD (chronic kidney disease) stage 3  CR 1. 36   Stable f/u with Dr Justin Mend  Essential hypertension   Pressures well-controlled on current regimen. No medication changes.  Pre-diabetes  A1c 6.1  Now on Farxiga   Pulmonary:  Chronically abnormal CXR/CT  Biopsy LLL done 02/16/20 abnormal PET scan including ? Mets to pancrease She indicates that she had to call Dr Ander Slade to get results of biopsy and was told Negative and that Dr Cristina Gong is involved with pancreatic issue She has no weigh loss, functional decrement CT chest stable 08/17/20   F/U 6 months     Darlene Wang

## 2020-12-19 DIAGNOSIS — I4892 Unspecified atrial flutter: Secondary | ICD-10-CM | POA: Diagnosis not present

## 2020-12-19 DIAGNOSIS — E1169 Type 2 diabetes mellitus with other specified complication: Secondary | ICD-10-CM | POA: Diagnosis not present

## 2020-12-19 DIAGNOSIS — R933 Abnormal findings on diagnostic imaging of other parts of digestive tract: Secondary | ICD-10-CM | POA: Diagnosis not present

## 2020-12-19 DIAGNOSIS — E7800 Pure hypercholesterolemia, unspecified: Secondary | ICD-10-CM | POA: Diagnosis not present

## 2020-12-19 DIAGNOSIS — Z79890 Hormone replacement therapy (postmenopausal): Secondary | ICD-10-CM | POA: Diagnosis not present

## 2020-12-19 DIAGNOSIS — I10 Essential (primary) hypertension: Secondary | ICD-10-CM | POA: Diagnosis not present

## 2020-12-19 DIAGNOSIS — R9389 Abnormal findings on diagnostic imaging of other specified body structures: Secondary | ICD-10-CM | POA: Diagnosis not present

## 2020-12-19 DIAGNOSIS — N183 Chronic kidney disease, stage 3 unspecified: Secondary | ICD-10-CM | POA: Diagnosis not present

## 2020-12-19 DIAGNOSIS — R946 Abnormal results of thyroid function studies: Secondary | ICD-10-CM | POA: Diagnosis not present

## 2020-12-20 DIAGNOSIS — N2581 Secondary hyperparathyroidism of renal origin: Secondary | ICD-10-CM | POA: Diagnosis not present

## 2020-12-20 DIAGNOSIS — D631 Anemia in chronic kidney disease: Secondary | ICD-10-CM | POA: Diagnosis not present

## 2020-12-20 DIAGNOSIS — I4892 Unspecified atrial flutter: Secondary | ICD-10-CM | POA: Diagnosis not present

## 2020-12-20 DIAGNOSIS — E782 Mixed hyperlipidemia: Secondary | ICD-10-CM | POA: Diagnosis not present

## 2020-12-20 DIAGNOSIS — N183 Chronic kidney disease, stage 3 unspecified: Secondary | ICD-10-CM | POA: Diagnosis not present

## 2020-12-20 DIAGNOSIS — Z0000 Encounter for general adult medical examination without abnormal findings: Secondary | ICD-10-CM | POA: Diagnosis not present

## 2020-12-20 DIAGNOSIS — I129 Hypertensive chronic kidney disease with stage 1 through stage 4 chronic kidney disease, or unspecified chronic kidney disease: Secondary | ICD-10-CM | POA: Diagnosis not present

## 2020-12-20 DIAGNOSIS — N1832 Chronic kidney disease, stage 3b: Secondary | ICD-10-CM | POA: Diagnosis not present

## 2020-12-20 DIAGNOSIS — N189 Chronic kidney disease, unspecified: Secondary | ICD-10-CM | POA: Diagnosis not present

## 2020-12-20 DIAGNOSIS — E1122 Type 2 diabetes mellitus with diabetic chronic kidney disease: Secondary | ICD-10-CM | POA: Diagnosis not present

## 2020-12-21 ENCOUNTER — Other Ambulatory Visit: Payer: Self-pay

## 2020-12-21 ENCOUNTER — Ambulatory Visit (INDEPENDENT_AMBULATORY_CARE_PROVIDER_SITE_OTHER): Payer: Medicare Other | Admitting: Cardiovascular Disease

## 2020-12-21 ENCOUNTER — Encounter: Payer: Self-pay | Admitting: Cardiovascular Disease

## 2020-12-21 VITALS — BP 110/62 | HR 59 | Ht 66.0 in | Wt 147.0 lb

## 2020-12-21 DIAGNOSIS — I480 Paroxysmal atrial fibrillation: Secondary | ICD-10-CM

## 2020-12-21 DIAGNOSIS — J849 Interstitial pulmonary disease, unspecified: Secondary | ICD-10-CM

## 2020-12-21 NOTE — Patient Instructions (Signed)
Medication Instructions:  *If you need a refill on your cardiac medications before your next appointment, please call your pharmacy*  Lab Work: If you have labs (blood work) drawn today and your tests are completely normal, you will receive your results only by: MyChart Message (if you have MyChart) OR A paper copy in the mail If you have any lab test that is abnormal or we need to change your treatment, we will call you to review the results.  Follow-Up: At [facility], you and your health needs are our priority.  As part of our continuing mission to provide you with exceptional heart care, we have created designated Provider Care Teams.  These Care Teams include your primary Cardiologist (physician) and Advanced Practice Providers (APPs -  Physician Assistants and Nurse Practitioners) who all work together to provide you with the care you need, when you need it.  We recommend signing up for the patient portal called "MyChart".  Sign up information is provided on this After Visit Summary.  MyChart is used to connect with patients for Virtual Visits (Telemedicine).  Patients are able to view lab/test results, encounter notes, upcoming appointments, etc.  Non-urgent messages can be sent to your provider as well.   To learn more about what you can do with MyChart, go to [email or web address].    Your next appointment:   1 year(s)  The format for your next appointment:   In Person  Provider:   You may see Peter Nishan, MD or one of the following Advanced Practice Providers on your designated Care Team:   Laura Ingold, NP  

## 2020-12-27 DIAGNOSIS — Z23 Encounter for immunization: Secondary | ICD-10-CM | POA: Diagnosis not present

## 2021-01-23 DIAGNOSIS — M1711 Unilateral primary osteoarthritis, right knee: Secondary | ICD-10-CM | POA: Diagnosis not present

## 2021-01-23 DIAGNOSIS — M25512 Pain in left shoulder: Secondary | ICD-10-CM | POA: Diagnosis not present

## 2021-02-04 DIAGNOSIS — R062 Wheezing: Secondary | ICD-10-CM | POA: Diagnosis not present

## 2021-02-04 DIAGNOSIS — J209 Acute bronchitis, unspecified: Secondary | ICD-10-CM | POA: Diagnosis not present

## 2021-02-04 DIAGNOSIS — R051 Acute cough: Secondary | ICD-10-CM | POA: Diagnosis not present

## 2021-02-20 ENCOUNTER — Other Ambulatory Visit: Payer: Self-pay

## 2021-02-20 MED ORDER — METOPROLOL TARTRATE 25 MG PO TABS
25.0000 mg | ORAL_TABLET | Freq: Two times a day (BID) | ORAL | 3 refills | Status: DC
Start: 2021-02-20 — End: 2021-12-16

## 2021-02-22 DIAGNOSIS — I10 Essential (primary) hypertension: Secondary | ICD-10-CM | POA: Diagnosis not present

## 2021-02-22 DIAGNOSIS — E7800 Pure hypercholesterolemia, unspecified: Secondary | ICD-10-CM | POA: Diagnosis not present

## 2021-02-22 DIAGNOSIS — N183 Chronic kidney disease, stage 3 unspecified: Secondary | ICD-10-CM | POA: Diagnosis not present

## 2021-02-22 DIAGNOSIS — D631 Anemia in chronic kidney disease: Secondary | ICD-10-CM | POA: Diagnosis not present

## 2021-02-22 DIAGNOSIS — N2581 Secondary hyperparathyroidism of renal origin: Secondary | ICD-10-CM | POA: Diagnosis not present

## 2021-02-22 DIAGNOSIS — I4891 Unspecified atrial fibrillation: Secondary | ICD-10-CM | POA: Diagnosis not present

## 2021-02-22 DIAGNOSIS — E1169 Type 2 diabetes mellitus with other specified complication: Secondary | ICD-10-CM | POA: Diagnosis not present

## 2021-02-22 DIAGNOSIS — E1122 Type 2 diabetes mellitus with diabetic chronic kidney disease: Secondary | ICD-10-CM | POA: Diagnosis not present

## 2021-04-22 DIAGNOSIS — K649 Unspecified hemorrhoids: Secondary | ICD-10-CM | POA: Diagnosis not present

## 2021-04-23 ENCOUNTER — Other Ambulatory Visit: Payer: Self-pay | Admitting: Cardiovascular Disease

## 2021-04-23 DIAGNOSIS — I484 Atypical atrial flutter: Secondary | ICD-10-CM

## 2021-04-23 NOTE — Telephone Encounter (Signed)
Eliquis 5mg  refill request received. Patient is 87 years old, weight-66.7kg, Crea-1.40 on 12/19/2020 via scanned labs from [place], [place], and last seen by Dr. Johnsie Cancel on 12/21/2020. Dose is appropriate based on dosing criteria. Will send in refill to requested pharmacy.

## 2021-05-31 DIAGNOSIS — Z1231 Encounter for screening mammogram for malignant neoplasm of breast: Secondary | ICD-10-CM | POA: Diagnosis not present

## 2021-06-18 DIAGNOSIS — N183 Chronic kidney disease, stage 3 unspecified: Secondary | ICD-10-CM | POA: Diagnosis not present

## 2021-06-18 DIAGNOSIS — E7800 Pure hypercholesterolemia, unspecified: Secondary | ICD-10-CM | POA: Diagnosis not present

## 2021-06-18 DIAGNOSIS — I4892 Unspecified atrial flutter: Secondary | ICD-10-CM | POA: Diagnosis not present

## 2021-06-18 DIAGNOSIS — E1169 Type 2 diabetes mellitus with other specified complication: Secondary | ICD-10-CM | POA: Diagnosis not present

## 2021-06-18 DIAGNOSIS — Z7901 Long term (current) use of anticoagulants: Secondary | ICD-10-CM | POA: Diagnosis not present

## 2021-06-18 DIAGNOSIS — I10 Essential (primary) hypertension: Secondary | ICD-10-CM | POA: Diagnosis not present

## 2021-06-19 DIAGNOSIS — H35371 Puckering of macula, right eye: Secondary | ICD-10-CM | POA: Diagnosis not present

## 2021-06-19 DIAGNOSIS — H40013 Open angle with borderline findings, low risk, bilateral: Secondary | ICD-10-CM | POA: Diagnosis not present

## 2021-06-19 DIAGNOSIS — Z961 Presence of intraocular lens: Secondary | ICD-10-CM | POA: Diagnosis not present

## 2021-07-01 DIAGNOSIS — R935 Abnormal findings on diagnostic imaging of other abdominal regions, including retroperitoneum: Secondary | ICD-10-CM | POA: Diagnosis not present

## 2021-07-18 DIAGNOSIS — I4892 Unspecified atrial flutter: Secondary | ICD-10-CM | POA: Diagnosis not present

## 2021-07-18 DIAGNOSIS — I129 Hypertensive chronic kidney disease with stage 1 through stage 4 chronic kidney disease, or unspecified chronic kidney disease: Secondary | ICD-10-CM | POA: Diagnosis not present

## 2021-07-18 DIAGNOSIS — R918 Other nonspecific abnormal finding of lung field: Secondary | ICD-10-CM | POA: Diagnosis not present

## 2021-07-18 DIAGNOSIS — N2581 Secondary hyperparathyroidism of renal origin: Secondary | ICD-10-CM | POA: Diagnosis not present

## 2021-07-18 DIAGNOSIS — K8689 Other specified diseases of pancreas: Secondary | ICD-10-CM | POA: Diagnosis not present

## 2021-07-18 DIAGNOSIS — N1832 Chronic kidney disease, stage 3b: Secondary | ICD-10-CM | POA: Diagnosis not present

## 2021-07-18 DIAGNOSIS — E1122 Type 2 diabetes mellitus with diabetic chronic kidney disease: Secondary | ICD-10-CM | POA: Diagnosis not present

## 2021-09-18 DIAGNOSIS — I10 Essential (primary) hypertension: Secondary | ICD-10-CM | POA: Diagnosis not present

## 2021-09-18 DIAGNOSIS — E1169 Type 2 diabetes mellitus with other specified complication: Secondary | ICD-10-CM | POA: Diagnosis not present

## 2021-09-18 DIAGNOSIS — E7800 Pure hypercholesterolemia, unspecified: Secondary | ICD-10-CM | POA: Diagnosis not present

## 2021-11-01 DIAGNOSIS — R9389 Abnormal findings on diagnostic imaging of other specified body structures: Secondary | ICD-10-CM | POA: Diagnosis not present

## 2021-11-01 DIAGNOSIS — N1832 Chronic kidney disease, stage 3b: Secondary | ICD-10-CM | POA: Diagnosis not present

## 2021-11-01 DIAGNOSIS — Z79890 Hormone replacement therapy (postmenopausal): Secondary | ICD-10-CM | POA: Diagnosis not present

## 2021-11-01 DIAGNOSIS — R978 Other abnormal tumor markers: Secondary | ICD-10-CM | POA: Diagnosis not present

## 2021-11-01 DIAGNOSIS — N183 Chronic kidney disease, stage 3 unspecified: Secondary | ICD-10-CM | POA: Diagnosis not present

## 2021-12-10 DIAGNOSIS — Z23 Encounter for immunization: Secondary | ICD-10-CM | POA: Diagnosis not present

## 2021-12-14 ENCOUNTER — Other Ambulatory Visit: Payer: Self-pay | Admitting: Cardiovascular Disease

## 2021-12-14 DIAGNOSIS — I484 Atypical atrial flutter: Secondary | ICD-10-CM

## 2021-12-16 NOTE — Telephone Encounter (Signed)
Prescription refill request for Eliquis received. Indication: Aflutter Last office visit: 12/21/20 Johnsie Cancel)  Scr: 1.40 (12/19/20)  Age: 88 Weight: 66.7kg  Appropriate dose and refill sent to requested pharmacy.

## 2021-12-17 DIAGNOSIS — Z23 Encounter for immunization: Secondary | ICD-10-CM | POA: Diagnosis not present

## 2022-01-05 ENCOUNTER — Other Ambulatory Visit: Payer: Self-pay | Admitting: Cardiovascular Disease

## 2022-03-24 ENCOUNTER — Other Ambulatory Visit: Payer: Self-pay | Admitting: Cardiovascular Disease

## 2022-03-29 NOTE — Progress Notes (Signed)
Cardiology Office Note:    Date:  04/09/2022   ID:  Darlene Wang, DOB 22-Oct-1934, MRN [id number]  PCP:  [name], MD  Cardiologist:  [name], MD   Referring MD: [name], MD   No chief complaint on file.    History of Present Illness:    88 y.o. seen in f/u for atypical flutter. 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 EF normal 60-65% mild MR/AR   Of noted, CT chest on 06/09/17 read as multilobar bronchopneumonia with recommendations for a follow up CXR in 4-6 weeks. She did not receive ABX during her admission and was afebrile without leukocytosis. She reported playing the clarinet without difficulty, and has known apical scarring. She indicates abnormal CXR;s for years Last CT 05/21/18 showed more LUL consolidation    She has no bleeding problems on eliquis. ASA was D/C'ed.  Eliquis dose is borderline for reduction to 2.5 mg bid Dosing with GFR 36 and Cr 1.46 with age over 49   No cardiac complaints  Her and husband at [facility] Has had vaccine   She has chronically abnormal CXR/CT with abnormal PET scan 01/20/20 Had biopsy of LLL on 02/16/20 Needs f/u with pulmonary [name] to discuss results Pancrease also shown to be abnormal on PET scan ? Metastatic adenocarcinoma She is not happy with pulmonary never called her with biopsy results When she called indicated not cancer. She is seeing [name] for pancrease   CT 08/17/20 stable multifocal peripheral consolidative opacities in both lungs  ? Eosinophilic pneumonia   She has had no weight loss or constitutional signs of cancer She has had more diuresis with [name]   Had nice trips to [place] and [name]. 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  Mad at pulmonary as they never called her with her biopsy results Has a new primary Dr Quintella Reichert     Will get BMET today to make sure GFR ok on eliquis   Past Medical History:  Diagnosis Date   CKD (chronic kidney disease)    stage 3    Diabetes mellitus without complication ([place])    pre-diabetes    Hypertension    Pre-diabetes     Past Surgical History:  Procedure Laterality Date   CARDIOVERSION N/A 06/10/2017   Procedure: CARDIOVERSION;  Surgeon: Sueanne Margarita, MD;  Location: [facility];  Service: Cardiovascular;  Laterality: N/A;   TEE WITHOUT CARDIOVERSION N/A 06/10/2017   Procedure: TRANSESOPHAGEAL ECHOCARDIOGRAM (TEE);  Surgeon: Sueanne Margarita, MD;  Location: [facility] ENDOSCOPY;  Service: Cardiovascular;  Laterality: N/A;    Current Medications: Current Meds  Medication Sig   acetaminophen (TYLENOL) 650 MG CR tablet Take 650 mg by mouth every 8 (eight) hours as needed for pain.   apixaban (ELIQUIS) 5 MG TABS tablet TAKE 1 TABLET BY MOUTH TWICE  DAILY   atorvastatin (LIPITOR) 40 MG tablet Take 40 mg by mouth at bedtime.   Calcium Carbonate-Vitamin D 600-200 MG-UNIT TABS Take 600 mg by mouth every other day.    cholecalciferol (VITAMIN D) 1000 units tablet Take 1,000 Units by mouth daily.   dapagliflozin propanediol (FARXIGA) 5 MG TABS tablet Take 5 mg by mouth daily.   estrogens, conjugated, (PREMARIN) 0.45 MG tablet Take 0.45 mg by mouth every other day.   lisinopril (ZESTRIL) 5 MG tablet  Take 5 mg by mouth at bedtime.    metoprolol tartrate (LOPRESSOR) 25 MG tablet TAKE 1 TABLET BY MOUTH TWICE  DAILY   Multiple Vitamin (MULTIVITAMIN) capsule Take 1 capsule by mouth daily. Centrum     Allergies:   Amoxicillin, Pneumococcal vac polyvalent, Pneumococcal vaccine, Tuberculin ppd, and Tuberculin tests   Social History   Socioeconomic History   Marital status: Widowed    Spouse name: Not on file   Number of children: Not on file   Years of education: Not on file   Highest education level: Not on file  Occupational History   Not on file  Tobacco Use   Smoking status:  Never   Smokeless tobacco: Never  Vaping Use   Vaping Use: Never used  Substance and Sexual Activity   Alcohol use: Not on file   Drug use: Never   Sexual activity: Not on file  Other Topics Concern   Not on file  Social History Narrative   Not on file   Social Determinants of Health   Financial Resource Strain: Not on file  Food Insecurity: Not on file  Transportation Needs: Not on file  Physical Activity: Not on file  Stress: Not on file  Social Connections: Not on file     Family History: The patient's family history includes Diabetes in her father; Heart failure in her mother; Stroke in her father and sister.  ROS:   Please see the history of present illness.     All other systems reviewed and are negative.  EKGs/Labs/Other Studies Reviewed:    The following studies were reviewed today:  Echocardiogram 06/09/17: Study Conclusions  - Left ventricle: The cavity size was normal. Wall thickness was increased in a pattern of mild LVH. Systolic function was normal. The estimated ejection fraction was in the range of 60% to 65%. Wall motion was normal; there were no regional wall motion abnormalities. The study is not technically sufficient to allow evaluation of LV diastolic function. - Aortic valve: There was trivial regurgitation. - Mitral valve: There was mild regurgitation.   Impressions:  - Normal LV function; mild LVH; trace AI; mild MR and TR.     TEE with DCCV: 06/10/17 Successful Aflutter>>NSR Results: Normal LV size and function with EF 60-65% Normal RV size and function Normal RA Mildly dilated LA with spontaneous echo contrast.  There is no thrombus in LAA or LA.  The LAA emptying velocity is mildly reduced at 35. Normal TV with mild to moderate TR Normal PV with mild PR Normal MV with mild MR Normal trileaflet AV with mild AR Normal interatrial septum with no evidence of shunt by colorflow dopper  Normal thoracic and ascending aorta.   EKG:   12/02/18 SR rate 76 nonspecific ST changes  04/09/2022 NSR rate 59 normal 04/09/2022 SR rate 59 normal PR 216 msec   Recent Labs: No results found for requested labs within last 365 days.  Recent Lipid Panel    Component Value Date/Time   CHOL 111 06/09/2017 0432   TRIG 88 06/09/2017 0432   HDL 60 06/09/2017 0432   CHOLHDL 1.9 06/09/2017 0432   VLDL 18 06/09/2017 0432   LDLCALC 33 06/09/2017 0432    Physical Exam:    VS:  BP 136/78   Pulse (!) 59   Ht '5\' 7"'$  (1.702 m)   Wt 140 lb (63.5 kg)   SpO2 99%   BMI 21.93 kg/m     Wt Readings from Last 3 Encounters:  04/09/22 140 lb (63.5 kg)  12/21/20 147 lb (66.7 kg)  06/15/20 145 lb (65.8 kg)     Affect appropriate Healthy:  appears stated age HEENT: normal Neck supple with no adenopathy JVP normal no bruits no thyromegaly Lungs clear with no wheezing and good diaphragmatic motion Heart:  S1/S2 no murmur, no rub, gallop or click PMI normal Abdomen: benighn, BS positve, no tenderness, no AAA no bruit.  No HSM or HJR Distal pulses intact with no bruits No edema Neuro non-focal Skin warm and dry No muscular weakness   ASSESSMENT:    Atrial flutter  PLAN:    In order of problems listed above:  Flutter:  Post TEE/DCC 06/10/17  Initially On low dose eliquis given age and CKD Digoxin d/c  Continue beta blocker most recent Cr on 12/2021 1.40 GFR 37 EF normal by TEE 06/10/17 with mild LAE  Her weight is above 60 kg and her last Cr we have was 1.4 so she qualified for normal dose eliquis despite age. She knows to keep close track of her Cr as she will eventually likely need decrease in eliquis dose to 2.5 bid  CKD (chronic kidney disease) stage 3  CR 1. 4 with GFR 37 October 2023   Essential hypertension   Pressures well-controlled on current regimen. No medication changes.  Pre-diabetes  A1c 6.1  Now on Farxiga   Pulmonary:  Chronically abnormal CXR/CT  Biopsy LLL done 02/16/20 abnormal PET scan including ? Mets to pancrease  She indicates that she had to call Dr Ander Slade to get results of biopsy and was told Negative and that Dr Cristina Gong is involved with pancreatic issue She has no weigh loss, functional decrement CT chest stable 08/17/20   BMET   F/U 6 months    [name]

## 2022-04-08 ENCOUNTER — Other Ambulatory Visit: Payer: Self-pay | Admitting: Cardiovascular Disease

## 2022-04-09 ENCOUNTER — Ambulatory Visit: Payer: Medicare Other | Attending: Cardiovascular Disease | Admitting: Cardiovascular Disease

## 2022-04-09 ENCOUNTER — Encounter: Payer: Self-pay | Admitting: Cardiovascular Disease

## 2022-04-09 VITALS — BP 136/78 | HR 59 | Ht 67.0 in | Wt 140.0 lb

## 2022-04-09 DIAGNOSIS — M6281 Muscle weakness (generalized): Secondary | ICD-10-CM | POA: Diagnosis not present

## 2022-04-09 DIAGNOSIS — M1711 Unilateral primary osteoarthritis, right knee: Secondary | ICD-10-CM | POA: Diagnosis not present

## 2022-04-09 DIAGNOSIS — I480 Paroxysmal atrial fibrillation: Secondary | ICD-10-CM | POA: Insufficient documentation

## 2022-04-09 DIAGNOSIS — Z5181 Encounter for therapeutic drug level monitoring: Secondary | ICD-10-CM

## 2022-04-09 DIAGNOSIS — J849 Interstitial pulmonary disease, unspecified: Secondary | ICD-10-CM

## 2022-04-09 DIAGNOSIS — Z7901 Long term (current) use of anticoagulants: Secondary | ICD-10-CM | POA: Diagnosis not present

## 2022-04-09 NOTE — Patient Instructions (Addendum)
Medication Instructions:  Your physician recommends that you continue on your current medications as directed. Please refer to the Current Medication list given to you today.  *If you need a refill on your cardiac medications before your next appointment, please call your pharmacy*   Lab Work: TODAY: BMP  If you have labs (blood work) drawn today and your tests are completely normal, you will receive your results only by: [place] (if you have MyChart) OR A paper copy in the mail If you have any lab test that is abnormal or we need to change your treatment, we will call you to review the results.   Testing/Procedures: NONE   Follow-Up: At [facility], you and your health needs are our priority.  As part of our continuing mission to provide you with exceptional heart care, we have created designated Provider Care Teams.  These Care Teams include your primary Cardiologist (physician) and Advanced Practice Providers (APPs -  Physician Assistants and Nurse Practitioners) who all work together to provide you with the care you need, when you need it.  We recommend signing up for the patient portal called "MyChart".  Sign up information is provided on this After Visit Summary.  MyChart is used to connect with patients for Virtual Visits (Telemedicine).  Patients are able to view lab/test results, encounter notes, upcoming appointments, etc.  Non-urgent messages can be sent to your provider as well.   To learn more about what you can do with MyChart, go to [email or web address].    Your next appointment:   1 year(s)  Provider:   [name], MD

## 2022-04-10 ENCOUNTER — Telehealth: Payer: Self-pay | Admitting: Cardiovascular Disease

## 2022-04-10 LAB — BASIC METABOLIC PANEL
BUN/Creatinine Ratio: 20 (ref 12–28)
BUN: 28 mg/dL — ABNORMAL HIGH (ref 8–27)
CO2: 23 mmol/L (ref 20–29)
Calcium: 9.7 mg/dL (ref 8.7–10.3)
Chloride: 103 mmol/L (ref 96–106)
Creatinine, Ser: 1.42 mg/dL — ABNORMAL HIGH (ref 0.57–1.00)
Glucose: 91 mg/dL (ref 70–99)
Potassium: 4.9 mmol/L (ref 3.5–5.2)
Sodium: 140 mmol/L (ref 134–144)
eGFR: 36 mL/min/{1.73_m2} — ABNORMAL LOW (ref >59)

## 2022-04-10 NOTE — Telephone Encounter (Signed)
Patient calling in bout her results. Please advise

## 2022-04-10 NOTE — Telephone Encounter (Signed)
Patient aware of her lab results.

## 2022-04-15 DIAGNOSIS — M6281 Muscle weakness (generalized): Secondary | ICD-10-CM | POA: Diagnosis not present

## 2022-04-15 DIAGNOSIS — M1711 Unilateral primary osteoarthritis, right knee: Secondary | ICD-10-CM | POA: Diagnosis not present

## 2022-04-18 DIAGNOSIS — M6281 Muscle weakness (generalized): Secondary | ICD-10-CM | POA: Diagnosis not present

## 2022-04-18 DIAGNOSIS — M1711 Unilateral primary osteoarthritis, right knee: Secondary | ICD-10-CM | POA: Diagnosis not present

## 2022-04-22 DIAGNOSIS — M6281 Muscle weakness (generalized): Secondary | ICD-10-CM | POA: Diagnosis not present

## 2022-04-22 DIAGNOSIS — M1711 Unilateral primary osteoarthritis, right knee: Secondary | ICD-10-CM | POA: Diagnosis not present

## 2022-04-23 DIAGNOSIS — R918 Other nonspecific abnormal finding of lung field: Secondary | ICD-10-CM | POA: Diagnosis not present

## 2022-04-23 DIAGNOSIS — I4892 Unspecified atrial flutter: Secondary | ICD-10-CM | POA: Diagnosis not present

## 2022-04-23 DIAGNOSIS — N183 Chronic kidney disease, stage 3 unspecified: Secondary | ICD-10-CM | POA: Diagnosis not present

## 2022-04-23 DIAGNOSIS — I129 Hypertensive chronic kidney disease with stage 1 through stage 4 chronic kidney disease, or unspecified chronic kidney disease: Secondary | ICD-10-CM | POA: Diagnosis not present

## 2022-04-23 DIAGNOSIS — E1122 Type 2 diabetes mellitus with diabetic chronic kidney disease: Secondary | ICD-10-CM | POA: Diagnosis not present

## 2022-04-23 DIAGNOSIS — K8689 Other specified diseases of pancreas: Secondary | ICD-10-CM | POA: Diagnosis not present

## 2022-04-23 DIAGNOSIS — N1832 Chronic kidney disease, stage 3b: Secondary | ICD-10-CM | POA: Diagnosis not present

## 2022-04-24 DIAGNOSIS — N183 Chronic kidney disease, stage 3 unspecified: Secondary | ICD-10-CM | POA: Diagnosis not present

## 2022-04-24 DIAGNOSIS — I4891 Unspecified atrial fibrillation: Secondary | ICD-10-CM | POA: Diagnosis not present

## 2022-04-24 DIAGNOSIS — E1169 Type 2 diabetes mellitus with other specified complication: Secondary | ICD-10-CM | POA: Diagnosis not present

## 2022-04-24 DIAGNOSIS — I10 Essential (primary) hypertension: Secondary | ICD-10-CM | POA: Diagnosis not present

## 2022-04-24 DIAGNOSIS — E7800 Pure hypercholesterolemia, unspecified: Secondary | ICD-10-CM | POA: Diagnosis not present

## 2022-04-25 DIAGNOSIS — M6281 Muscle weakness (generalized): Secondary | ICD-10-CM | POA: Diagnosis not present

## 2022-04-25 DIAGNOSIS — M1711 Unilateral primary osteoarthritis, right knee: Secondary | ICD-10-CM | POA: Diagnosis not present

## 2022-04-28 ENCOUNTER — Other Ambulatory Visit: Payer: Self-pay | Admitting: Cardiovascular Disease

## 2022-04-29 DIAGNOSIS — M6281 Muscle weakness (generalized): Secondary | ICD-10-CM | POA: Diagnosis not present

## 2022-04-29 DIAGNOSIS — M1711 Unilateral primary osteoarthritis, right knee: Secondary | ICD-10-CM | POA: Diagnosis not present

## 2022-04-30 DIAGNOSIS — M6281 Muscle weakness (generalized): Secondary | ICD-10-CM | POA: Diagnosis not present

## 2022-04-30 DIAGNOSIS — M1711 Unilateral primary osteoarthritis, right knee: Secondary | ICD-10-CM | POA: Diagnosis not present

## 2022-05-03 ENCOUNTER — Other Ambulatory Visit: Payer: Self-pay | Admitting: Cardiovascular Disease

## 2022-05-03 DIAGNOSIS — I484 Atypical atrial flutter: Secondary | ICD-10-CM

## 2022-05-05 NOTE — Telephone Encounter (Signed)
Eliquis 29m refill request received. Patient is 88years old, weight-63.5kg, Crea-1.42 on 04/09/22, Diagnosis-Afib, and last seen by Dr. NJohnsie Cancelon 04/09/22. Dose is appropriate based on dosing criteria. Will send in refill to requested pharmacy.

## 2022-05-06 DIAGNOSIS — M6281 Muscle weakness (generalized): Secondary | ICD-10-CM | POA: Diagnosis not present

## 2022-05-06 DIAGNOSIS — M1711 Unilateral primary osteoarthritis, right knee: Secondary | ICD-10-CM | POA: Diagnosis not present

## 2022-05-08 ENCOUNTER — Other Ambulatory Visit: Payer: Self-pay | Admitting: Cardiovascular Disease

## 2022-05-08 ENCOUNTER — Telehealth: Payer: Self-pay | Admitting: Cardiovascular Disease

## 2022-05-08 DIAGNOSIS — M6281 Muscle weakness (generalized): Secondary | ICD-10-CM | POA: Diagnosis not present

## 2022-05-08 DIAGNOSIS — M1711 Unilateral primary osteoarthritis, right knee: Secondary | ICD-10-CM | POA: Diagnosis not present

## 2022-05-08 MED ORDER — METOPROLOL TARTRATE 25 MG PO TABS: 25.0000 mg | ORAL_TABLET | Freq: Two times a day (BID) | ORAL | 0 refills | Status: DC

## 2022-05-08 NOTE — Telephone Encounter (Signed)
Pt's medication was sent to pt's pharmacy as requested. Confirmation received.  °

## 2022-05-08 NOTE — Telephone Encounter (Signed)
*  STAT* If patient is at the pharmacy, call can be transferred to refill team.   1. Which medications need to be refilled? (please list name of each medication and dose if known) metoprolol tartrate (LOPRESSOR) 25 MG tablet   2. Which pharmacy/location (including street and city if local pharmacy) is medication to be sent to?    [facility]  [address]  [place], [place] [address]   3. Do they need a 30 day or 90 day supply? 55    Pt states that [facility] has not mailed out her prescription yet and she is out of meds, please send to local pharmacy.

## 2022-05-12 ENCOUNTER — Other Ambulatory Visit: Payer: Self-pay

## 2022-05-12 ENCOUNTER — Telehealth: Payer: Self-pay | Admitting: Cardiovascular Disease

## 2022-05-12 MED ORDER — METOPROLOL TARTRATE 25 MG PO TABS: 25.0000 mg | ORAL_TABLET | Freq: Two times a day (BID) | ORAL | 3 refills | Status: DC

## 2022-05-12 NOTE — Telephone Encounter (Signed)
I did not need this encounter. °

## 2022-05-13 DIAGNOSIS — M6281 Muscle weakness (generalized): Secondary | ICD-10-CM | POA: Diagnosis not present

## 2022-05-13 DIAGNOSIS — M1711 Unilateral primary osteoarthritis, right knee: Secondary | ICD-10-CM | POA: Diagnosis not present

## 2022-05-15 DIAGNOSIS — M6281 Muscle weakness (generalized): Secondary | ICD-10-CM | POA: Diagnosis not present

## 2022-05-15 DIAGNOSIS — M1711 Unilateral primary osteoarthritis, right knee: Secondary | ICD-10-CM | POA: Diagnosis not present

## 2022-05-21 DIAGNOSIS — M6281 Muscle weakness (generalized): Secondary | ICD-10-CM | POA: Diagnosis not present

## 2022-05-21 DIAGNOSIS — M1711 Unilateral primary osteoarthritis, right knee: Secondary | ICD-10-CM | POA: Diagnosis not present

## 2022-05-23 DIAGNOSIS — M1711 Unilateral primary osteoarthritis, right knee: Secondary | ICD-10-CM | POA: Diagnosis not present

## 2022-05-23 DIAGNOSIS — M6281 Muscle weakness (generalized): Secondary | ICD-10-CM | POA: Diagnosis not present

## 2022-05-28 DIAGNOSIS — M6281 Muscle weakness (generalized): Secondary | ICD-10-CM | POA: Diagnosis not present

## 2022-05-28 DIAGNOSIS — M1711 Unilateral primary osteoarthritis, right knee: Secondary | ICD-10-CM | POA: Diagnosis not present

## 2022-05-29 DIAGNOSIS — Z6822 Body mass index (BMI) 22.0-22.9, adult: Secondary | ICD-10-CM | POA: Diagnosis not present

## 2022-05-29 DIAGNOSIS — Z1389 Encounter for screening for other disorder: Secondary | ICD-10-CM | POA: Diagnosis not present

## 2022-05-29 DIAGNOSIS — Z0000 Encounter for general adult medical examination without abnormal findings: Secondary | ICD-10-CM | POA: Diagnosis not present

## 2022-05-30 DIAGNOSIS — M1711 Unilateral primary osteoarthritis, right knee: Secondary | ICD-10-CM | POA: Diagnosis not present

## 2022-05-30 DIAGNOSIS — M6281 Muscle weakness (generalized): Secondary | ICD-10-CM | POA: Diagnosis not present

## 2022-06-04 DIAGNOSIS — M6281 Muscle weakness (generalized): Secondary | ICD-10-CM | POA: Diagnosis not present

## 2022-06-04 DIAGNOSIS — M1711 Unilateral primary osteoarthritis, right knee: Secondary | ICD-10-CM | POA: Diagnosis not present

## 2022-06-05 DIAGNOSIS — E1122 Type 2 diabetes mellitus with diabetic chronic kidney disease: Secondary | ICD-10-CM | POA: Diagnosis not present

## 2022-06-05 DIAGNOSIS — R9389 Abnormal findings on diagnostic imaging of other specified body structures: Secondary | ICD-10-CM | POA: Diagnosis not present

## 2022-06-05 DIAGNOSIS — I4892 Unspecified atrial flutter: Secondary | ICD-10-CM | POA: Diagnosis not present

## 2022-06-05 DIAGNOSIS — Z6822 Body mass index (BMI) 22.0-22.9, adult: Secondary | ICD-10-CM | POA: Diagnosis not present

## 2022-06-05 DIAGNOSIS — E7800 Pure hypercholesterolemia, unspecified: Secondary | ICD-10-CM | POA: Diagnosis not present

## 2022-06-05 DIAGNOSIS — D6869 Other thrombophilia: Secondary | ICD-10-CM | POA: Diagnosis not present

## 2022-06-05 DIAGNOSIS — N1832 Chronic kidney disease, stage 3b: Secondary | ICD-10-CM | POA: Diagnosis not present

## 2022-06-10 ENCOUNTER — Telehealth: Payer: Self-pay | Admitting: Pulmonary Disease

## 2022-06-10 DIAGNOSIS — M1711 Unilateral primary osteoarthritis, right knee: Secondary | ICD-10-CM | POA: Diagnosis not present

## 2022-06-10 DIAGNOSIS — M6281 Muscle weakness (generalized): Secondary | ICD-10-CM | POA: Diagnosis not present

## 2022-06-10 NOTE — Telephone Encounter (Signed)
Received referral from Dr Vanessa Tribbey with dx of Abnormal CT scan. LOV 01/03/20 with Dr Ander Slade. Pt is wanting to switch to seeing Dr Valeta Harms. Please advise on switch.

## 2022-06-11 NOTE — Telephone Encounter (Signed)
Dr. Ander Slade and Dr. Valeta Harms are you okay with patient switching. Please advise

## 2022-06-12 DIAGNOSIS — M6281 Muscle weakness (generalized): Secondary | ICD-10-CM | POA: Diagnosis not present

## 2022-06-12 DIAGNOSIS — M1711 Unilateral primary osteoarthritis, right knee: Secondary | ICD-10-CM | POA: Diagnosis not present

## 2022-06-12 NOTE — Telephone Encounter (Signed)
Okay with me 

## 2022-06-13 ENCOUNTER — Other Ambulatory Visit: Payer: Self-pay | Admitting: Cardiovascular Disease

## 2022-06-13 DIAGNOSIS — Z1231 Encounter for screening mammogram for malignant neoplasm of breast: Secondary | ICD-10-CM | POA: Diagnosis not present

## 2022-06-16 NOTE — Telephone Encounter (Signed)
ATC X1 LVM for patient to call the office back. Please schedule pt with Dr. Valeta Harms for new patient apt

## 2022-06-17 DIAGNOSIS — M1711 Unilateral primary osteoarthritis, right knee: Secondary | ICD-10-CM | POA: Diagnosis not present

## 2022-06-17 DIAGNOSIS — M6281 Muscle weakness (generalized): Secondary | ICD-10-CM | POA: Diagnosis not present

## 2022-06-19 DIAGNOSIS — M6281 Muscle weakness (generalized): Secondary | ICD-10-CM | POA: Diagnosis not present

## 2022-06-19 DIAGNOSIS — M1711 Unilateral primary osteoarthritis, right knee: Secondary | ICD-10-CM | POA: Diagnosis not present

## 2022-06-23 NOTE — Telephone Encounter (Signed)
Reviewed patient's chart. She has been scheduled as a new patient with Dr. Tonia Brooms on 05/09. Will close encounter.

## 2022-06-24 DIAGNOSIS — M6281 Muscle weakness (generalized): Secondary | ICD-10-CM | POA: Diagnosis not present

## 2022-06-24 DIAGNOSIS — M1711 Unilateral primary osteoarthritis, right knee: Secondary | ICD-10-CM | POA: Diagnosis not present

## 2022-06-25 DIAGNOSIS — H40013 Open angle with borderline findings, low risk, bilateral: Secondary | ICD-10-CM | POA: Diagnosis not present

## 2022-06-25 DIAGNOSIS — H35371 Puckering of macula, right eye: Secondary | ICD-10-CM | POA: Diagnosis not present

## 2022-06-25 DIAGNOSIS — Z961 Presence of intraocular lens: Secondary | ICD-10-CM | POA: Diagnosis not present

## 2022-06-27 DIAGNOSIS — M6281 Muscle weakness (generalized): Secondary | ICD-10-CM | POA: Diagnosis not present

## 2022-06-27 DIAGNOSIS — M1711 Unilateral primary osteoarthritis, right knee: Secondary | ICD-10-CM | POA: Diagnosis not present

## 2022-07-01 DIAGNOSIS — M1711 Unilateral primary osteoarthritis, right knee: Secondary | ICD-10-CM | POA: Diagnosis not present

## 2022-07-01 DIAGNOSIS — M6281 Muscle weakness (generalized): Secondary | ICD-10-CM | POA: Diagnosis not present

## 2022-07-04 DIAGNOSIS — M1711 Unilateral primary osteoarthritis, right knee: Secondary | ICD-10-CM | POA: Diagnosis not present

## 2022-07-04 DIAGNOSIS — M6281 Muscle weakness (generalized): Secondary | ICD-10-CM | POA: Diagnosis not present

## 2022-07-08 DIAGNOSIS — M6281 Muscle weakness (generalized): Secondary | ICD-10-CM | POA: Diagnosis not present

## 2022-07-08 DIAGNOSIS — M1711 Unilateral primary osteoarthritis, right knee: Secondary | ICD-10-CM | POA: Diagnosis not present

## 2022-07-10 DIAGNOSIS — M1711 Unilateral primary osteoarthritis, right knee: Secondary | ICD-10-CM | POA: Diagnosis not present

## 2022-07-10 DIAGNOSIS — M6281 Muscle weakness (generalized): Secondary | ICD-10-CM | POA: Diagnosis not present

## 2022-07-16 DIAGNOSIS — M6281 Muscle weakness (generalized): Secondary | ICD-10-CM | POA: Diagnosis not present

## 2022-07-16 DIAGNOSIS — M1711 Unilateral primary osteoarthritis, right knee: Secondary | ICD-10-CM | POA: Diagnosis not present

## 2022-07-18 DIAGNOSIS — M6281 Muscle weakness (generalized): Secondary | ICD-10-CM | POA: Diagnosis not present

## 2022-07-18 DIAGNOSIS — M1711 Unilateral primary osteoarthritis, right knee: Secondary | ICD-10-CM | POA: Diagnosis not present

## 2022-07-23 DIAGNOSIS — M1711 Unilateral primary osteoarthritis, right knee: Secondary | ICD-10-CM | POA: Diagnosis not present

## 2022-07-23 DIAGNOSIS — M6281 Muscle weakness (generalized): Secondary | ICD-10-CM | POA: Diagnosis not present

## 2022-07-24 ENCOUNTER — Ambulatory Visit (INDEPENDENT_AMBULATORY_CARE_PROVIDER_SITE_OTHER): Payer: Medicare Other | Admitting: Pulmonary Disease

## 2022-07-24 ENCOUNTER — Encounter: Payer: Self-pay | Admitting: Pulmonary Disease

## 2022-07-24 VITALS — BP 150/70 | HR 62 | Ht 67.0 in | Wt 143.4 lb

## 2022-07-24 DIAGNOSIS — D869 Sarcoidosis, unspecified: Secondary | ICD-10-CM

## 2022-07-24 NOTE — Progress Notes (Signed)
Synopsis: Referred in May 2024 for abnormal CT chest by Rankins, Fanny Dance, MD  Subjective:   PATIENT ID: Darlene Wang GENDER: female DOB: Dec 27, 1934, MRN: [id number]  Chief Complaint  Patient presents with   Consult    Lung nodule.    This is a 88 year old female, past medical history of CKD diabetes hypertension.  Referred for abnormal CT imaging.  Patient has no symptoms related to her respiratory system.  No complaints today.  She does have significant abnormal CT scan that shows upper lobe predominant patchy consolidated infiltrates as well as lesions within the mediastinum and the periphery of the lung.  Patient underwent a CT-guided biopsy that shows focal areas of nonnecrotizing granulomas.  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  Patient has no respiratory complaints.  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     Past Medical History:  Diagnosis Date   CKD (chronic kidney disease)    stage 3    Diabetes mellitus without complication (HCC)    pre-diabetes    Hypertension    Pre-diabetes      Family History  Problem Relation Age of Onset   Heart failure Mother    Stroke Father    Diabetes Father    Stroke Sister      Past Surgical History:  Procedure Laterality Date   CARDIOVERSION N/A 06/10/2017   Procedure: CARDIOVERSION;  Surgeon: Quintella Reichert, MD;  Location: [facility] ENDOSCOPY;  Service: Cardiovascular;  Laterality: N/A;   TEE WITHOUT CARDIOVERSION N/A 06/10/2017   Procedure: TRANSESOPHAGEAL ECHOCARDIOGRAM (TEE);  Surgeon: Quintella Reichert, MD;  Location: [facility] ENDOSCOPY;  Service: Cardiovascular;  Laterality: N/A;    Social History   Socioeconomic History   Marital status: Widowed    Spouse name: Not on file   Number of children: Not on file   Years of education: Not on file   Highest education level: Not on file  Occupational History   Not on file  Tobacco Use    Smoking status: Never   Smokeless tobacco: Never  Vaping Use   Vaping Use: Never used  Substance and Sexual Activity   Alcohol use: Not on file   Drug use: Never   Sexual activity: Not on file  Other Topics Concern   Not on file  Social History Narrative   Not on file   Social Determinants of Health   Financial Resource Strain: Not on file  Food Insecurity: Not on file  Transportation Needs: Not on file  Physical Activity: Not on file  Stress: Not on file  Social Connections: Not on file  Intimate Partner Violence: Not on file     Allergies  Allergen Reactions   Amoxicillin Other (See Comments) and Swelling   Pneumococcal Vac Polyvalent Other (See Comments)    Other Reaction(s): significant swelling in arm with 2nd shot   Pneumococcal Vaccine Swelling    swelling    Tuberculin Ppd Swelling    swelling    Tuberculin Tests Other (See Comments)    Other Reaction(s): allergic reaction     Outpatient Medications Prior to Visit  Medication Sig Dispense Refill   acetaminophen (TYLENOL) 650 MG CR tablet Take 650 mg by mouth every 8 (eight) hours as needed for pain.     apixaban (ELIQUIS) 5 MG TABS tablet TAKE 1 TABLET BY MOUTH TWICE  DAILY 180 tablet 2   atorvastatin (LIPITOR) 40 MG tablet Take  40 mg by mouth at bedtime.     Calcium Carbonate-Vitamin D 600-200 MG-UNIT TABS Take 600 mg by mouth every other day.      cholecalciferol (VITAMIN D) 1000 units tablet Take 1,000 Units by mouth daily.     dapagliflozin propanediol (FARXIGA) 5 MG TABS tablet Take 5 mg by mouth daily.     estrogens, conjugated, (PREMARIN) 0.45 MG tablet Take 0.45 mg by mouth every other day.     lisinopril (ZESTRIL) 5 MG tablet Take 5 mg by mouth at bedtime.      metoprolol tartrate (LOPRESSOR) 25 MG tablet Take 1 tablet (25 mg total) by mouth 2 (two) times daily. 180 tablet 3   Multiple Vitamin (MULTIVITAMIN) capsule Take 1 capsule by mouth daily. Centrum     No facility-administered medications  prior to visit.    Review of Systems  Constitutional:  Negative for chills, fever, malaise/fatigue and weight loss.  HENT:  Negative for hearing loss, sore throat and tinnitus.   Eyes:  Negative for blurred vision and double vision.  Respiratory:  Negative for cough, hemoptysis, sputum production, shortness of breath, wheezing and stridor.   Cardiovascular:  Negative for chest pain, palpitations, orthopnea, leg swelling and PND.  Gastrointestinal:  Negative for abdominal pain, constipation, diarrhea, heartburn, nausea and vomiting.  Genitourinary:  Negative for dysuria, hematuria and urgency.  Musculoskeletal:  Negative for joint pain and myalgias.  Skin:  Negative for itching and rash.  Neurological:  Negative for dizziness, tingling, weakness and headaches.  Endo/Heme/Allergies:  Negative for environmental allergies. Does not bruise/bleed easily.  Psychiatric/Behavioral:  Negative for depression. The patient is not nervous/anxious and does not have insomnia.   All other systems reviewed and are negative.    Objective:  Physical Exam Vitals reviewed.  Constitutional:      General: She is not in acute distress.    Appearance: She is well-developed.  HENT:     Head: Normocephalic and atraumatic.  Eyes:     General: No scleral icterus.    Conjunctiva/sclera: Conjunctivae normal.     Pupils: Pupils are equal, round, and reactive to light.  Neck:     Vascular: No JVD.     Trachea: No tracheal deviation.  Cardiovascular:     Rate and Rhythm: Normal rate and regular rhythm.     Heart sounds: Normal heart sounds. No murmur heard. Pulmonary:     Effort: Pulmonary effort is normal. No tachypnea, accessory muscle usage or respiratory distress.     Breath sounds: No stridor. No wheezing, rhonchi or rales.  Abdominal:     General: There is no distension.     Palpations: Abdomen is soft.     Tenderness: There is no abdominal tenderness.  Musculoskeletal:        General: No  tenderness.     Cervical back: Neck supple.  Lymphadenopathy:     Cervical: No cervical adenopathy.  Skin:    General: Skin is warm and dry.     Capillary Refill: Capillary refill takes less than 2 seconds.     Findings: No rash.  Neurological:     Mental Status: She is alert and oriented to person, place, and time.  Psychiatric:        Behavior: Behavior normal.      Vitals:   07/24/22 1401  BP: (!) 150/70  Pulse: 62  SpO2: 95%  Weight: 143 lb 6.4 oz (65 kg)  Height: 5\' 7"  (1.702 m)   95% on RA BMI Readings  from Last 3 Encounters:  07/24/22 22.46 kg/m  04/09/22 21.93 kg/m  12/21/20 23.73 kg/m   Wt Readings from Last 3 Encounters:  07/24/22 143 lb 6.4 oz (65 kg)  04/09/22 140 lb (63.5 kg)  12/21/20 147 lb (66.7 kg)     CBC    Component Value Date/Time   WBC 5.6 02/16/2020 1011   RBC 4.15 02/16/2020 1011   HGB 12.2 02/16/2020 1011   HCT 38.9 02/16/2020 1011   PLT 194 02/16/2020 1011   MCV 93.7 02/16/2020 1011   MCH 29.4 02/16/2020 1011   MCHC 31.4 02/16/2020 1011   RDW 14.2 02/16/2020 1011    Chest Imaging:  June 2022 CT chest: CT imaging with upper lobe predominant patchy consolidations others within the periphery of the lung. The patient's images have been independently reviewed by me.    Pulmonary Functions Testing Results:     No data to display          FeNO:   Pathology:   Echocardiogram:   Heart Catheterization:     Assessment & Plan:     ICD-10-CM   1. Sarcoidosis  D86.9       Discussion:  This is a 88 year old female with abnormal CT imaging. I think that she has sarcoidosis.  Pathology from recent CT-guided needle biopsy is consistent with focal areas of nonnecrotizing granulomas.  She has CT radiographic evidence of upper lobe predominant dense consolidated areas that appear of scarring as well as within the periphery of the lung and mediastinal structures.  Plan: Patient is asymptomatic.  I do not think they need  treatment at this time. Would recommend that they follow-up with Dr. Everardo All in sarcoidosis clinic.    Current Outpatient Medications:    acetaminophen (TYLENOL) 650 MG CR tablet, Take 650 mg by mouth every 8 (eight) hours as needed for pain., Disp: , Rfl:    apixaban (ELIQUIS) 5 MG TABS tablet, TAKE 1 TABLET BY MOUTH TWICE  DAILY, Disp: 180 tablet, Rfl: 2   atorvastatin (LIPITOR) 40 MG tablet, Take 40 mg by mouth at bedtime., Disp: , Rfl:    Calcium Carbonate-Vitamin D 600-200 MG-UNIT TABS, Take 600 mg by mouth every other day. , Disp: , Rfl:    cholecalciferol (VITAMIN D) 1000 units tablet, Take 1,000 Units by mouth daily., Disp: , Rfl:    dapagliflozin propanediol (FARXIGA) 5 MG TABS tablet, Take 5 mg by mouth daily., Disp: , Rfl:    estrogens, conjugated, (PREMARIN) 0.45 MG tablet, Take 0.45 mg by mouth every other day., Disp: , Rfl:    lisinopril (ZESTRIL) 5 MG tablet, Take 5 mg by mouth at bedtime. , Disp: , Rfl:    metoprolol tartrate (LOPRESSOR) 25 MG tablet, Take 1 tablet (25 mg total) by mouth 2 (two) times daily., Disp: 180 tablet, Rfl: 3   Multiple Vitamin (MULTIVITAMIN) capsule, Take 1 capsule by mouth daily. Centrum, Disp: , Rfl:    Josephine Igo, DO [facility] 07/24/2022 2:33 PM

## 2022-07-24 NOTE — Patient Instructions (Addendum)
Thank you for visiting Dr. Tonia Brooms at [facility]. Today we recommend the following:  Return if symptoms worsen or fail to improve. 30 Min appt with Dr. Everardo All in Sarcoidosis clinic    Please do your part to reduce the spread of COVID-19.

## 2022-07-25 DIAGNOSIS — M6281 Muscle weakness (generalized): Secondary | ICD-10-CM | POA: Diagnosis not present

## 2022-07-25 DIAGNOSIS — M1711 Unilateral primary osteoarthritis, right knee: Secondary | ICD-10-CM | POA: Diagnosis not present

## 2022-07-30 DIAGNOSIS — M6281 Muscle weakness (generalized): Secondary | ICD-10-CM | POA: Diagnosis not present

## 2022-07-30 DIAGNOSIS — M1711 Unilateral primary osteoarthritis, right knee: Secondary | ICD-10-CM | POA: Diagnosis not present

## 2022-08-01 DIAGNOSIS — M1711 Unilateral primary osteoarthritis, right knee: Secondary | ICD-10-CM | POA: Diagnosis not present

## 2022-08-01 DIAGNOSIS — M6281 Muscle weakness (generalized): Secondary | ICD-10-CM | POA: Diagnosis not present

## 2022-08-06 DIAGNOSIS — M6281 Muscle weakness (generalized): Secondary | ICD-10-CM | POA: Diagnosis not present

## 2022-08-06 DIAGNOSIS — M1711 Unilateral primary osteoarthritis, right knee: Secondary | ICD-10-CM | POA: Diagnosis not present

## 2022-08-08 DIAGNOSIS — M6281 Muscle weakness (generalized): Secondary | ICD-10-CM | POA: Diagnosis not present

## 2022-08-08 DIAGNOSIS — M1711 Unilateral primary osteoarthritis, right knee: Secondary | ICD-10-CM | POA: Diagnosis not present

## 2022-08-13 DIAGNOSIS — M1711 Unilateral primary osteoarthritis, right knee: Secondary | ICD-10-CM | POA: Diagnosis not present

## 2022-08-13 DIAGNOSIS — M6281 Muscle weakness (generalized): Secondary | ICD-10-CM | POA: Diagnosis not present

## 2022-08-28 ENCOUNTER — Encounter (HOSPITAL_BASED_OUTPATIENT_CLINIC_OR_DEPARTMENT_OTHER): Payer: Self-pay | Admitting: Pulmonary Disease

## 2022-08-28 ENCOUNTER — Ambulatory Visit (INDEPENDENT_AMBULATORY_CARE_PROVIDER_SITE_OTHER): Payer: Medicare Other | Admitting: Pulmonary Disease

## 2022-08-28 VITALS — BP 156/66 | HR 62 | Temp 98.2°F | Ht 67.0 in | Wt 142.2 lb

## 2022-08-28 DIAGNOSIS — D869 Sarcoidosis, unspecified: Secondary | ICD-10-CM

## 2022-08-28 NOTE — Patient Instructions (Addendum)
Pulmonary sarcoidosis - burnt out --Dx in 02/16/20 via core biopsy --No indication for immunosuppressants  Sarcoid Monitoring --Recent chest imaging reviewed.  --No indication for PFTs. Asymptomatic --Annual ophthalmology exam.  Last visit on 06/25/22

## 2022-08-28 NOTE — Progress Notes (Signed)
Synopsis: Referred in May 2024 for abnormal CT chest by Rankins, Fanny Dance, MD  Subjective:   PATIENT ID: Darlene Wang GENDER: female DOB: 04-23-1934, MRN: [id number]  Chief Complaint  Patient presents with   Follow-up    Follow up. Patient has no complaints.      88 year old female with atypical atrial flutter, CKD stage III diabetes hypertension who presents for presumed sarcoid follow-up. Initially referred to [facility] for abnormal CT imaging. Since 1995 she has reported abnormal findings that usually prompt additional work-up. Patient has no symptoms related to her respiratory system.  She does have significant abnormal CT scan that shows upper lobe predominant patchy consolidated infiltrates as well as lesions within the mediastinum and the periphery of the lung.  Patient underwent a CT-guided biopsy that shows focal areas of nonnecrotizing granulomas.  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  Patient has no respiratory complaints.  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  Denies fever, fatigue, weight loss, visual disturbance, dyspnea, cough, palpitations, abdominal pain, numbness, tingling, lightheadedness, syncope   Past Medical History:  Diagnosis Date   CKD (chronic kidney disease)    stage 3    Diabetes mellitus without complication (HCC)    pre-diabetes    Hypertension    Pre-diabetes      Family History  Problem Relation Age of Onset   Heart failure Mother    Stroke Father    Diabetes Father    Stroke Sister      Past Surgical History:  Procedure Laterality Date   CARDIOVERSION N/A 06/10/2017   Procedure: CARDIOVERSION;  Surgeon: Quintella Reichert, MD;  Location: [facility] ENDOSCOPY;  Service: Cardiovascular;  Laterality: N/A;   TEE WITHOUT CARDIOVERSION N/A 06/10/2017   Procedure: TRANSESOPHAGEAL ECHOCARDIOGRAM (TEE);  Surgeon: Quintella Reichert, MD;  Location: [facility]  ENDOSCOPY;  Service: Cardiovascular;  Laterality: N/A;    Social History   Socioeconomic History   Marital status: Widowed    Spouse name: Not on file   Number of children: Not on file   Years of education: Not on file   Highest education level: Not on file  Occupational History   Not on file  Tobacco Use   Smoking status: Never   Smokeless tobacco: Never  Vaping Use   Vaping Use: Never used  Substance and Sexual Activity   Alcohol use: Not on file   Drug use: Never   Sexual activity: Not on file  Other Topics Concern   Not on file  Social History Narrative   Not on file   Social Determinants of Health   Financial Resource Strain: Not on file  Food Insecurity: Not on file  Transportation Needs: Not on file  Physical Activity: Not on file  Stress: Not on file  Social Connections: Not on file  Intimate Partner Violence: Not on file     Allergies  Allergen Reactions   Amoxicillin Other (See Comments) and Swelling   Pneumococcal Vac Polyvalent Other (See Comments)    Other Reaction(s): significant swelling in arm with 2nd shot   Pneumococcal Vaccine Swelling    swelling    Tuberculin Ppd Swelling    swelling    Tuberculin Tests Other (See Comments)    Other Reaction(s): allergic reaction     Outpatient Medications Prior to Visit  Medication Sig Dispense Refill   acetaminophen (TYLENOL) 650 MG CR tablet Take 650 mg by mouth every  8 (eight) hours as needed for pain.     apixaban (ELIQUIS) 5 MG TABS tablet TAKE 1 TABLET BY MOUTH TWICE  DAILY 180 tablet 2   atorvastatin (LIPITOR) 40 MG tablet Take 40 mg by mouth at bedtime.     Calcium Carbonate-Vitamin D 600-200 MG-UNIT TABS Take 600 mg by mouth every other day.      cholecalciferol (VITAMIN D) 1000 units tablet Take 1,000 Units by mouth daily.     dapagliflozin propanediol (FARXIGA) 5 MG TABS tablet Take 5 mg by mouth daily.     estrogens, conjugated, (PREMARIN) 0.45 MG tablet Take 0.45 mg by mouth every other  day.     lisinopril (ZESTRIL) 5 MG tablet Take 5 mg by mouth at bedtime.      metoprolol tartrate (LOPRESSOR) 25 MG tablet Take 1 tablet (25 mg total) by mouth 2 (two) times daily. 180 tablet 3   Multiple Vitamin (MULTIVITAMIN) capsule Take 1 capsule by mouth daily. Centrum     No facility-administered medications prior to visit.    Review of Systems  Constitutional:  Negative for chills, diaphoresis, fever, malaise/fatigue and weight loss.  HENT:  Negative for congestion.   Respiratory:  Negative for cough, hemoptysis, sputum production, shortness of breath and wheezing.   Cardiovascular:  Negative for chest pain, palpitations and leg swelling.     Objective:   Vitals:   08/28/22 1417  BP: (!) 156/66  Pulse: 62  Temp: 98.2 F (36.8 C)  TempSrc: Oral  SpO2: 98%  Weight: 142 lb 3.2 oz (64.5 kg)  Height: 5\' 7"  (1.702 m)   98% on RA BMI Readings from Last 3 Encounters:  08/28/22 22.27 kg/m  07/24/22 22.46 kg/m  04/09/22 21.93 kg/m   Wt Readings from Last 3 Encounters:  08/28/22 142 lb 3.2 oz (64.5 kg)  07/24/22 143 lb 6.4 oz (65 kg)  04/09/22 140 lb (63.5 kg)   Physical Exam: General: Well-appearing, no acute distress HENT: [place], AT Eyes: EOMI, no scleral icterus Respiratory: Clear to auscultation bilaterally.  No crackles, wheezing or rales Cardiovascular: RRR, -M/R/G, no JVD Extremities:-Edema,-tenderness Neuro: AAO x4, CNII-XII grossly intact Psych: Normal mood, normal affect   CBC    Component Value Date/Time   WBC 5.6 02/16/2020 1011   RBC 4.15 02/16/2020 1011   HGB 12.2 02/16/2020 1011   HCT 38.9 02/16/2020 1011   PLT 194 02/16/2020 1011   MCV 93.7 02/16/2020 1011   MCH 29.4 02/16/2020 1011   MCHC 31.4 02/16/2020 1011   RDW 14.2 02/16/2020 1011    Chest Imaging:  June 2022 CT chest: CT imaging with upper lobe predominant patchy consolidations others within the periphery of the lung. The patient's images have been independently reviewed by me.     Pulmonary Functions Testing Results:     No data to display         Pathology:  IR biopsy 02/16/20 FINAL MICROSCOPIC DIAGNOSIS:   A. LUNG, LEFT LOWER LOBE, NEEDLE CORE BIOPSY:  - Inflammation, fibrosis and non-necrotizing granulomas.  - No evidence of malignancy.  - See comment   COMMENT:  The core biopsies show dense lymphoplasmacytic infiltrates within the  airspaces and peribronchioles associated with focal fibrosis.  There is  also a small aggregate of several non-necrotizing granulomas with giant  cells.     Assessment & Plan:     ICD-10-CM   1. Sarcoidosis  D86.9       Discussion: 88 year old year old female with pulmonary sarcoid. Extensively  reviewed CT imaging and biopsy results and seems consistent with burnt-out sarcoid. Recent CT Chest in 2022 even demonstrated improved scattered opacities. Asymptomatic. Addressed questions and concerns.  We discussed the clinical course of sarcoid and management including serial PFTs, labs, eye exam, and EKG and chest imaging if indicated. If symptoms suggest sarcoid flare in the future, we would manage with steroids +/- biologics.  Pulmonary sarcoidosis - burnt out --Dx in 02/16/20 via core biopsy --No indication for immunosuppressants  Sarcoid Monitoring --Recent chest imaging reviewed.  --No indication breathings --Annual ophthalmology exam.  Last visit on 06/25/22 --Routine labs as needed: CBC with diff, CMET, 1, 25 and 25 hydroxy vitamin D, urinary calcium   Current Outpatient Medications:    acetaminophen (TYLENOL) 650 MG CR tablet, Take 650 mg by mouth every 8 (eight) hours as needed for pain., Disp: , Rfl:    apixaban (ELIQUIS) 5 MG TABS tablet, TAKE 1 TABLET BY MOUTH TWICE  DAILY, Disp: 180 tablet, Rfl: 2   atorvastatin (LIPITOR) 40 MG tablet, Take 40 mg by mouth at bedtime., Disp: , Rfl:    Calcium Carbonate-Vitamin D 600-200 MG-UNIT TABS, Take 600 mg by mouth every other day. , Disp: , Rfl:    cholecalciferol  (VITAMIN D) 1000 units tablet, Take 1,000 Units by mouth daily., Disp: , Rfl:    dapagliflozin propanediol (FARXIGA) 5 MG TABS tablet, Take 5 mg by mouth daily., Disp: , Rfl:    estrogens, conjugated, (PREMARIN) 0.45 MG tablet, Take 0.45 mg by mouth every other day., Disp: , Rfl:    lisinopril (ZESTRIL) 5 MG tablet, Take 5 mg by mouth at bedtime. , Disp: , Rfl:    metoprolol tartrate (LOPRESSOR) 25 MG tablet, Take 1 tablet (25 mg total) by mouth 2 (two) times daily., Disp: 180 tablet, Rfl: 3   Multiple Vitamin (MULTIVITAMIN) capsule, Take 1 capsule by mouth daily. Centrum, Disp: , Rfl:   I have spent a total time of 45-minutes on the day of the appointment including chart review, data review, collecting history, coordinating care and discussing medical diagnosis and plan with the patient/family. Past medical history, allergies, medications were reviewed. Pertinent imaging, labs and tests included in this note have been reviewed and interpreted independently by me.  Darlene Wang Mechele Collin, MD [facility] 08/28/2022 2:53 PM

## 2022-12-03 ENCOUNTER — Other Ambulatory Visit: Payer: Self-pay | Admitting: Cardiovascular Disease

## 2022-12-03 DIAGNOSIS — I484 Atypical atrial flutter: Secondary | ICD-10-CM

## 2022-12-03 NOTE — Telephone Encounter (Signed)
Eliquis 5mg  refill request received. Patient is 88 years old, weight-64.5kg, Crea-1.42 on 04/09/22, Diagnosis-Afib, and last seen by Dr. Eden Emms on 04/09/22. Dose is appropriate based on dosing criteria. Will send in refill to requested pharmacy.

## 2022-12-16 DIAGNOSIS — I129 Hypertensive chronic kidney disease with stage 1 through stage 4 chronic kidney disease, or unspecified chronic kidney disease: Secondary | ICD-10-CM | POA: Diagnosis not present

## 2022-12-16 DIAGNOSIS — N189 Chronic kidney disease, unspecified: Secondary | ICD-10-CM | POA: Diagnosis not present

## 2022-12-16 DIAGNOSIS — D860 Sarcoidosis of lung: Secondary | ICD-10-CM | POA: Diagnosis not present

## 2022-12-16 DIAGNOSIS — N2581 Secondary hyperparathyroidism of renal origin: Secondary | ICD-10-CM | POA: Diagnosis not present

## 2022-12-16 DIAGNOSIS — D631 Anemia in chronic kidney disease: Secondary | ICD-10-CM | POA: Diagnosis not present

## 2022-12-16 DIAGNOSIS — N1832 Chronic kidney disease, stage 3b: Secondary | ICD-10-CM | POA: Diagnosis not present

## 2022-12-16 DIAGNOSIS — E1122 Type 2 diabetes mellitus with diabetic chronic kidney disease: Secondary | ICD-10-CM | POA: Diagnosis not present

## 2022-12-16 DIAGNOSIS — I4892 Unspecified atrial flutter: Secondary | ICD-10-CM | POA: Diagnosis not present

## 2022-12-16 DIAGNOSIS — Z23 Encounter for immunization: Secondary | ICD-10-CM | POA: Diagnosis not present

## 2022-12-23 DIAGNOSIS — Z23 Encounter for immunization: Secondary | ICD-10-CM | POA: Diagnosis not present

## 2023-01-13 DIAGNOSIS — Z79890 Hormone replacement therapy (postmenopausal): Secondary | ICD-10-CM | POA: Diagnosis not present

## 2023-01-13 DIAGNOSIS — E7800 Pure hypercholesterolemia, unspecified: Secondary | ICD-10-CM | POA: Diagnosis not present

## 2023-01-13 DIAGNOSIS — N1832 Chronic kidney disease, stage 3b: Secondary | ICD-10-CM | POA: Diagnosis not present

## 2023-01-13 DIAGNOSIS — E1122 Type 2 diabetes mellitus with diabetic chronic kidney disease: Secondary | ICD-10-CM | POA: Diagnosis not present

## 2023-01-20 DIAGNOSIS — N1832 Chronic kidney disease, stage 3b: Secondary | ICD-10-CM | POA: Diagnosis not present

## 2023-01-20 DIAGNOSIS — E1122 Type 2 diabetes mellitus with diabetic chronic kidney disease: Secondary | ICD-10-CM | POA: Diagnosis not present

## 2023-01-20 DIAGNOSIS — E7800 Pure hypercholesterolemia, unspecified: Secondary | ICD-10-CM | POA: Diagnosis not present

## 2023-02-02 DIAGNOSIS — R933 Abnormal findings on diagnostic imaging of other parts of digestive tract: Secondary | ICD-10-CM | POA: Diagnosis not present

## 2023-02-02 DIAGNOSIS — K649 Unspecified hemorrhoids: Secondary | ICD-10-CM | POA: Diagnosis not present

## 2023-02-23 DIAGNOSIS — B349 Viral infection, unspecified: Secondary | ICD-10-CM | POA: Diagnosis not present

## 2023-02-23 DIAGNOSIS — Z03818 Encounter for observation for suspected exposure to other biological agents ruled out: Secondary | ICD-10-CM | POA: Diagnosis not present

## 2023-02-23 DIAGNOSIS — R509 Fever, unspecified: Secondary | ICD-10-CM | POA: Diagnosis not present

## 2023-03-04 ENCOUNTER — Other Ambulatory Visit: Payer: Self-pay | Admitting: Cardiovascular Disease

## 2023-04-01 NOTE — Progress Notes (Unsigned)
Cardiology Office Note:    Date:  04/15/2023   ID:  Darlene Wang, DOB 12-16-1934, MRN [id number]  PCP:  Clayborn Heron, MD  Cardiologist:  Charlton Haws, MD   Referring MD: Clayborn Heron, MD   No chief complaint on file.    History of Present Illness:    89 y.o. seen in f/u for atypical flutter. History of HTN, CKD and lung nodule Noted rapid HR;s in March 2019 She is a retired Engineer, civil (consulting). Because her rate was difficult to control she underwent TEE/DCC 06/10/17 by Dr Mayford Knife. EF normal 60-65% mild MR/AR   Of noted, CT chest on 06/09/17 read as multilobar bronchopneumonia with recommendations for a follow up CXR in 4-6 weeks. She did not receive ABX during her admission and was afebrile without leukocytosis. She reported playing the clarinet without difficulty, and has known apical scarring. She indicates abnormal CXR;s for years Last CT 05/21/18 showed more LUL consolidation    She has no bleeding problems on eliquis. ASA was D/C'ed.  Eliquis dose is borderline for reduction to 2.5 mg bid Dosing with GFR 36 and Cr 1.46 with age over 89   No cardiac complaints  Her and husband at [place] assisted [facility] Has had vaccine   She has chronically abnormal CXR/CT with abnormal PET scan 01/20/20 Had biopsy of LLL on 02/16/20 Needs f/u with pulmonary [name] to discuss results Pancrease also shown to be abnormal on PET scan ? Metastatic adenocarcinoma She is not happy with pulmonary never called her with biopsy results When she called indicated not cancer. She is seeing [name] for pancrease   CT 08/17/20 stable multifocal peripheral consolidative opacities in both lungs  ? Eosinophilic pneumonia   She has had no weight loss or constitutional signs of cancer She has had more diuresis with Marcelline Deist   Had nice trips to [place] and [employer]. 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  Mad at pulmonary as they never called her with her biopsy results Has a new primary Dr Pennelope Bracken     04/10/23 K 4.9 Cr 1.42 GFR 36 Dr Signe Colt follows kidney function   Past Medical History:  Diagnosis Date   CKD (chronic kidney disease)    stage 3    Diabetes mellitus without complication (HCC)    pre-diabetes    Hypertension    Pre-diabetes     Past Surgical History:  Procedure Laterality Date   CARDIOVERSION N/A 06/10/2017   Procedure: CARDIOVERSION;  Surgeon: Quintella Reichert, MD;  Location: [facility] ENDOSCOPY;  Service: Cardiovascular;  Laterality: N/A;   TEE WITHOUT CARDIOVERSION N/A 06/10/2017   Procedure: TRANSESOPHAGEAL ECHOCARDIOGRAM (TEE);  Surgeon: Quintella Reichert, MD;  Location: [facility] ENDOSCOPY;  Service: Cardiovascular;  Laterality: N/A;    Current Medications: Current Meds  Medication Sig   acetaminophen (TYLENOL) 650 MG CR tablet Take 650 mg by mouth every 8 (eight) hours as needed for pain.   apixaban (ELIQUIS) 5 MG TABS tablet TAKE 1 TABLET BY MOUTH TWICE  DAILY   atorvastatin (LIPITOR) 40 MG tablet Take 40 mg by mouth at bedtime.   Calcium Carbonate-Vitamin D 600-200 MG-UNIT TABS Take 600 mg by mouth every other day.    cholecalciferol (VITAMIN D) 1000 units tablet Take 1,000 Units by mouth daily.   dapagliflozin propanediol (FARXIGA) 5 MG TABS tablet Take 5 mg by mouth daily.   estrogens, conjugated, (PREMARIN) 0.45 MG tablet Take 0.45 mg by mouth every other day.   lisinopril (ZESTRIL) 5 MG  tablet Take 5 mg by mouth at bedtime.    metoprolol tartrate (LOPRESSOR) 25 MG tablet TAKE 1 TABLET BY MOUTH TWICE  DAILY   Multiple Vitamin (MULTIVITAMIN) capsule Take 1 capsule by mouth daily. Centrum     Allergies:   Amoxicillin, Pneumococcal vac polyvalent, Pneumococcal vaccine, Tuberculin ppd, and Tuberculin tests   Social History   Socioeconomic History   Marital status: Widowed    Spouse name: Not on file   Number of children: Not on file   Years of education: Not on file   Highest education level: Not on file  Occupational History   Not on file  Tobacco Use    Smoking status: Never   Smokeless tobacco: Never  Vaping Use   Vaping status: Never Used  Substance and Sexual Activity   Alcohol use: Not on file   Drug use: Never   Sexual activity: Not on file  Other Topics Concern   Not on file  Social History Narrative   Not on file   Social Drivers of Health   Financial Resource Strain: Not on file  Food Insecurity: Not on file  Transportation Needs: Not on file  Physical Activity: Not on file  Stress: Not on file  Social Connections: Not on file     Family History: The patient's family history includes Diabetes in her father; Heart failure in her mother; Stroke in her father and sister.  ROS:   Please see the history of present illness.     All other systems reviewed and are negative.  EKGs/Labs/Other Studies Reviewed:    The following studies were reviewed today:  Echocardiogram 06/09/17: Study Conclusions  - Left ventricle: The cavity size was normal. Wall thickness was increased in a pattern of mild LVH. Systolic function was normal. The estimated ejection fraction was in the range of 60% to 65%. Wall motion was normal; there were no regional wall motion abnormalities. The study is not technically sufficient to allow evaluation of LV diastolic function. - Aortic valve: There was trivial regurgitation. - Mitral valve: There was mild regurgitation.   Impressions:  - Normal LV function; mild LVH; trace AI; mild MR and TR.     TEE with DCCV: 06/10/17 Successful Aflutter>>NSR Results: Normal LV size and function with EF 60-65% Normal RV size and function Normal RA Mildly dilated LA with spontaneous echo contrast.  There is no thrombus in LAA or LA.  The LAA emptying velocity is mildly reduced at 35. Normal TV with mild to moderate TR Normal PV with mild PR Normal MV with mild MR Normal trileaflet AV with mild AR Normal interatrial septum with no evidence of shunt by colorflow dopper  Normal thoracic and ascending  aorta.   EKG: SR PAC no acute changes   Recent Labs: No results found for requested labs within last 365 days.  Recent Lipid Panel    Component Value Date/Time   CHOL 111 06/09/2017 0432   TRIG 88 06/09/2017 0432   HDL 60 06/09/2017 0432   CHOLHDL 1.9 06/09/2017 0432   VLDL 18 06/09/2017 0432   LDLCALC 33 06/09/2017 0432    Physical Exam:    VS:  BP 110/62   Pulse (!) 59   Ht 5\' 7"  (1.702 m)   Wt 142 lb (64.4 kg)   SpO2 94%   BMI 22.24 kg/m     Wt Readings from Last 3 Encounters:  04/15/23 142 lb (64.4 kg)  08/28/22 142 lb 3.2 oz (64.5 kg)  07/24/22 143  lb 6.4 oz (65 kg)     Affect appropriate Healthy:  appears stated age HEENT: normal Neck supple with no adenopathy JVP normal no bruits no thyromegaly Lungs clear with no wheezing and good diaphragmatic motion Heart:  S1/S2 no murmur, no rub, gallop or click PMI normal Abdomen: benighn, BS positve, no tenderness, no AAA no bruit.  No HSM or HJR Distal pulses intact with no bruits No edema Neuro non-focal Skin warm and dry No muscular weakness   ASSESSMENT:    Atrial flutter  PLAN:    In order of problems listed above:  Flutter:  Post TEE/DCC 06/10/17  Initially On low dose eliquis given age and CKD Digoxin d/c  Continue beta blocker most Cr on 12/2021 1.40 GFR 37 EF normal by TEE 06/10/17 with mild LAE  Her weight is above 60 kg and her last Cr we have was 1.4 so she qualified for normal dose eliquis despite age. She knows to keep close track of her Cr as she will eventually likely need decrease in eliquis dose to 2.5 bid  CKD (chronic kidney disease) stage 3  CR 1.42 with GFR 36 04/09/22   Essential hypertension   Pressures well-controlled on current regimen. No medication changes.  Pre-diabetes  A1c 6.1  Now on Farxiga   Pulmonary:  Chronically abnormal CXR/CT  Biopsy LLL done 02/16/20 abnormal PET scan including ? Mets to pancrease She indicates that she had to call Dr Wynona Neat to get results of biopsy  and was told Negative and that Dr Matthias Hughs is involved with pancreatic issue She has no weigh loss, functional decrement CT chest stable 08/17/20   BMET Refer back to pulmonary/CT chest ? Non contrast   F/U 6 months    Charlton Haws

## 2023-04-15 ENCOUNTER — Ambulatory Visit: Payer: Medicare Other | Attending: Cardiovascular Disease | Admitting: Cardiovascular Disease

## 2023-04-15 VITALS — BP 110/62 | HR 59 | Ht 67.0 in | Wt 142.0 lb

## 2023-04-15 DIAGNOSIS — Z5181 Encounter for therapeutic drug level monitoring: Secondary | ICD-10-CM | POA: Diagnosis not present

## 2023-04-15 DIAGNOSIS — J849 Interstitial pulmonary disease, unspecified: Secondary | ICD-10-CM | POA: Diagnosis not present

## 2023-04-15 DIAGNOSIS — I480 Paroxysmal atrial fibrillation: Secondary | ICD-10-CM | POA: Diagnosis not present

## 2023-04-15 DIAGNOSIS — Z7901 Long term (current) use of anticoagulants: Secondary | ICD-10-CM | POA: Insufficient documentation

## 2023-04-15 NOTE — Patient Instructions (Signed)
Medication Instructions:  Your physician recommends that you continue on your current medications as directed. Please refer to the Current Medication list given to you today.  Labwork: NONE  Testing/Procedures: NONE  Follow-Up: Your physician wants you to follow-up in: 12 months with Dr. Eden Emms. You will receive a reminder letter in the mail two months in advance. If you don't receive a letter, please call our office to schedule the follow-up appointment.   If you need a refill on your cardiac medications before your next appointment, please call your pharmacy.

## 2023-04-28 DIAGNOSIS — H9209 Otalgia, unspecified ear: Secondary | ICD-10-CM | POA: Diagnosis not present

## 2023-06-10 ENCOUNTER — Other Ambulatory Visit: Payer: Self-pay | Admitting: Cardiovascular Disease

## 2023-06-19 DIAGNOSIS — Z1231 Encounter for screening mammogram for malignant neoplasm of breast: Secondary | ICD-10-CM | POA: Diagnosis not present

## 2023-07-14 DIAGNOSIS — E1122 Type 2 diabetes mellitus with diabetic chronic kidney disease: Secondary | ICD-10-CM | POA: Diagnosis not present

## 2023-07-14 DIAGNOSIS — I480 Paroxysmal atrial fibrillation: Secondary | ICD-10-CM | POA: Diagnosis not present

## 2023-07-14 DIAGNOSIS — Z79890 Hormone replacement therapy (postmenopausal): Secondary | ICD-10-CM | POA: Diagnosis not present

## 2023-07-14 DIAGNOSIS — E7800 Pure hypercholesterolemia, unspecified: Secondary | ICD-10-CM | POA: Diagnosis not present

## 2023-07-14 DIAGNOSIS — N1832 Chronic kidney disease, stage 3b: Secondary | ICD-10-CM | POA: Diagnosis not present

## 2023-07-14 DIAGNOSIS — L989 Disorder of the skin and subcutaneous tissue, unspecified: Secondary | ICD-10-CM | POA: Diagnosis not present

## 2023-07-14 DIAGNOSIS — H6121 Impacted cerumen, right ear: Secondary | ICD-10-CM | POA: Diagnosis not present

## 2023-07-16 DIAGNOSIS — I129 Hypertensive chronic kidney disease with stage 1 through stage 4 chronic kidney disease, or unspecified chronic kidney disease: Secondary | ICD-10-CM | POA: Diagnosis not present

## 2023-07-16 DIAGNOSIS — I4892 Unspecified atrial flutter: Secondary | ICD-10-CM | POA: Diagnosis not present

## 2023-07-16 DIAGNOSIS — E1122 Type 2 diabetes mellitus with diabetic chronic kidney disease: Secondary | ICD-10-CM | POA: Diagnosis not present

## 2023-07-16 DIAGNOSIS — D860 Sarcoidosis of lung: Secondary | ICD-10-CM | POA: Diagnosis not present

## 2023-07-16 DIAGNOSIS — N183 Chronic kidney disease, stage 3 unspecified: Secondary | ICD-10-CM | POA: Diagnosis not present

## 2023-07-22 DIAGNOSIS — L814 Other melanin hyperpigmentation: Secondary | ICD-10-CM | POA: Diagnosis not present

## 2023-07-22 DIAGNOSIS — D492 Neoplasm of unspecified behavior of bone, soft tissue, and skin: Secondary | ICD-10-CM | POA: Diagnosis not present

## 2023-07-22 DIAGNOSIS — L821 Other seborrheic keratosis: Secondary | ICD-10-CM | POA: Diagnosis not present

## 2023-07-22 DIAGNOSIS — B079 Viral wart, unspecified: Secondary | ICD-10-CM | POA: Diagnosis not present

## 2023-07-24 DIAGNOSIS — N184 Chronic kidney disease, stage 4 (severe): Secondary | ICD-10-CM | POA: Diagnosis not present

## 2023-07-24 DIAGNOSIS — H35371 Puckering of macula, right eye: Secondary | ICD-10-CM | POA: Diagnosis not present

## 2023-07-24 DIAGNOSIS — E119 Type 2 diabetes mellitus without complications: Secondary | ICD-10-CM | POA: Diagnosis not present

## 2023-07-24 DIAGNOSIS — Z961 Presence of intraocular lens: Secondary | ICD-10-CM | POA: Diagnosis not present

## 2023-07-24 DIAGNOSIS — H40013 Open angle with borderline findings, low risk, bilateral: Secondary | ICD-10-CM | POA: Diagnosis not present

## 2023-08-19 DIAGNOSIS — L814 Other melanin hyperpigmentation: Secondary | ICD-10-CM | POA: Diagnosis not present

## 2023-08-19 DIAGNOSIS — L821 Other seborrheic keratosis: Secondary | ICD-10-CM | POA: Diagnosis not present

## 2023-08-19 DIAGNOSIS — B079 Viral wart, unspecified: Secondary | ICD-10-CM | POA: Diagnosis not present

## 2023-12-15 DIAGNOSIS — Z23 Encounter for immunization: Secondary | ICD-10-CM | POA: Diagnosis not present

## 2023-12-24 ENCOUNTER — Other Ambulatory Visit: Payer: Self-pay | Admitting: Cardiovascular Disease

## 2024-01-12 DIAGNOSIS — I480 Paroxysmal atrial fibrillation: Secondary | ICD-10-CM | POA: Diagnosis not present

## 2024-01-12 DIAGNOSIS — K625 Hemorrhage of anus and rectum: Secondary | ICD-10-CM | POA: Diagnosis not present

## 2024-01-12 DIAGNOSIS — E1122 Type 2 diabetes mellitus with diabetic chronic kidney disease: Secondary | ICD-10-CM | POA: Diagnosis not present

## 2024-01-12 DIAGNOSIS — N1832 Chronic kidney disease, stage 3b: Secondary | ICD-10-CM | POA: Diagnosis not present

## 2024-01-12 DIAGNOSIS — Z79890 Hormone replacement therapy (postmenopausal): Secondary | ICD-10-CM | POA: Diagnosis not present

## 2024-01-12 DIAGNOSIS — E7800 Pure hypercholesterolemia, unspecified: Secondary | ICD-10-CM | POA: Diagnosis not present
